# Patient Record
Sex: Female | Born: 1965 | Race: Black or African American | Hispanic: No | Marital: Single | Smoking: Never smoker
Health system: Southern US, Community
[De-identification: ages and names within clinical notes are randomized; demographics above are authoritative.]

## PROBLEM LIST (undated history)

## (undated) DIAGNOSIS — I10 Essential (primary) hypertension: Secondary | ICD-10-CM

---

## 1998-10-25 ENCOUNTER — Encounter: Admission: RE | Admit: 1998-10-25 | Discharge: 1998-10-25 | Payer: Self-pay | Admitting: *Deleted

## 1999-11-26 ENCOUNTER — Emergency Department (HOSPITAL_COMMUNITY): Admission: EM | Admit: 1999-11-26 | Discharge: 1999-11-26 | Payer: Self-pay | Admitting: Emergency Medicine

## 1999-11-26 ENCOUNTER — Encounter: Payer: Self-pay | Admitting: Emergency Medicine

## 2000-07-04 ENCOUNTER — Ambulatory Visit (HOSPITAL_COMMUNITY): Admission: RE | Admit: 2000-07-04 | Discharge: 2000-07-04 | Payer: Self-pay | Admitting: Family Medicine

## 2000-07-04 ENCOUNTER — Encounter: Payer: Self-pay | Admitting: Family Medicine

## 2000-11-14 ENCOUNTER — Ambulatory Visit (HOSPITAL_COMMUNITY): Admission: RE | Admit: 2000-11-14 | Discharge: 2000-11-14 | Payer: Self-pay | Admitting: Family Medicine

## 2000-11-14 ENCOUNTER — Encounter: Payer: Self-pay | Admitting: Family Medicine

## 2001-03-07 ENCOUNTER — Ambulatory Visit (HOSPITAL_COMMUNITY): Admission: RE | Admit: 2001-03-07 | Discharge: 2001-03-07 | Payer: Self-pay | Admitting: Family Medicine

## 2001-03-07 ENCOUNTER — Encounter: Payer: Self-pay | Admitting: Family Medicine

## 2012-03-13 ENCOUNTER — Other Ambulatory Visit: Payer: Self-pay | Admitting: Family Medicine

## 2012-03-13 DIAGNOSIS — Z1231 Encounter for screening mammogram for malignant neoplasm of breast: Secondary | ICD-10-CM

## 2012-04-02 ENCOUNTER — Ambulatory Visit: Payer: Self-pay

## 2012-04-08 ENCOUNTER — Ambulatory Visit: Payer: Self-pay

## 2012-05-13 ENCOUNTER — Ambulatory Visit
Admission: RE | Admit: 2012-05-13 | Discharge: 2012-05-13 | Disposition: A | Discharge status: Home / Self Care | Payer: Commercial Indemnity | Source: Ambulatory Visit | Attending: Family Medicine | Admitting: Family Medicine

## 2012-05-13 DIAGNOSIS — Z1231 Encounter for screening mammogram for malignant neoplasm of breast: Secondary | ICD-10-CM

## 2014-02-04 ENCOUNTER — Encounter (HOSPITAL_BASED_OUTPATIENT_CLINIC_OR_DEPARTMENT_OTHER): Payer: Self-pay | Admitting: Emergency Medicine

## 2014-02-04 ENCOUNTER — Emergency Department (HOSPITAL_BASED_OUTPATIENT_CLINIC_OR_DEPARTMENT_OTHER)
Admission: EM | Admit: 2014-02-04 | Discharge: 2014-02-04 | Disposition: A | Discharge status: Home / Self Care | Payer: Commercial Indemnity | Attending: Emergency Medicine | Admitting: Emergency Medicine

## 2014-02-04 DIAGNOSIS — Z880 Allergy status to penicillin: Secondary | ICD-10-CM | POA: Insufficient documentation

## 2014-02-04 DIAGNOSIS — Z79899 Other long term (current) drug therapy: Secondary | ICD-10-CM | POA: Diagnosis not present

## 2014-02-04 DIAGNOSIS — I10 Essential (primary) hypertension: Secondary | ICD-10-CM | POA: Insufficient documentation

## 2014-02-04 DIAGNOSIS — J029 Acute pharyngitis, unspecified: Secondary | ICD-10-CM | POA: Insufficient documentation

## 2014-02-04 DIAGNOSIS — J3489 Other specified disorders of nose and nasal sinuses: Secondary | ICD-10-CM | POA: Diagnosis present

## 2014-02-04 HISTORY — DX: Essential (primary) hypertension: I10

## 2014-02-04 LAB — RAPID STREP SCREEN (MED CTR MEBANE ONLY): Streptococcus, Group A Screen (Direct): NEGATIVE

## 2014-02-04 MED ORDER — DEXAMETHASONE 4 MG PO TABS
10.0000 mg | ORAL_TABLET | Freq: Once | ORAL | Status: AC
  Administered 2014-02-04: 10 mg via ORAL

## 2014-02-04 MED ORDER — DEXAMETHASONE 4 MG PO TABS
ORAL_TABLET | ORAL | Status: AC
  Filled 2014-02-04: qty 3

## 2014-02-04 NOTE — ED Notes (Addendum)
Patient states she has had a sore throat for the last week, which is associated with generalized tiredness, facial swelling, and non productive cough during the night.  During the day time, patient states she feels like her chest is tight.

## 2014-02-04 NOTE — ED Provider Notes (Signed)
Medical screening examination/treatment/procedure(s) were performed by non-physician practitioner and as supervising physician I was immediately available for consultation/collaboration.    Nelia Shi, MD 02/04/14 1255

## 2014-02-04 NOTE — Discharge Instructions (Signed)

## 2014-02-04 NOTE — ED Provider Notes (Signed)
CSN: [id number]     Arrival date & time 02/04/14  1147 History   First MD Initiated Contact with Patient 02/04/14 1207     Chief Complaint  Patient presents with  . URI     (Consider location/radiation/quality/duration/timing/severity/associated sxs/prior Treatment) Patient is a 48 y.o. female presenting with URI. The history is provided by the patient. No language interpreter was used.  URI Presenting symptoms: congestion, cough and sore throat   Presenting symptoms: no fever   Severity:  Moderate Onset quality:  Sudden Duration:  1 week Timing:  Constant Progression:  Unchanged Chronicity:  New Relieved by:  Nothing Worsened by:  Nothing tried Ineffective treatments:  None tried Associated symptoms: no headaches, no myalgias, no neck pain, no sinus pain and no sneezing     Past Medical History  Diagnosis Date  . Hypertension    History reviewed. No pertinent past surgical history. No family history on file. History  Substance Use Topics  . Smoking status: Never Smoker   . Smokeless tobacco: Never Used  . Alcohol Use: Yes     Comment: occassionally   OB History   Grav Para Term Preterm Abortions TAB SAB Ect Mult Living                 Review of Systems  Constitutional: Negative for fever.  HENT: Positive for congestion and sore throat. Negative for sneezing.   Respiratory: Positive for cough.   Cardiovascular: Negative.   Musculoskeletal: Negative for myalgias and neck pain.  Neurological: Negative for headaches.      Allergies  Penicillins  Home Medications   Prior to Admission medications   Medication Sig Start Date End Date Taking? Authorizing Provider  triamterene-hydrochlorothiazide (MAXZIDE) 75-50 MG per tablet Take 1 tablet by mouth daily.   Yes Historical Provider, MD   BP 140/83  Pulse 78  Temp(Src) 98.4 F (36.9 C) (Oral)  Resp 18  Ht  (1.575 m)  Wt 218 lb (98.884 kg)  BMI 39.86 kg/m2  SpO2 97%  LMP 01/22/2014 Physical Exam   Nursing note and vitals reviewed. Constitutional: She is oriented to person, place, and time. She appears well-developed and well-nourished.  HENT:  Right Ear: External ear normal.  Left Ear: External ear normal.  Nose: Rhinorrhea present.  Mouth/Throat: Posterior oropharyngeal erythema present.  Cardiovascular: Normal rate and regular rhythm.   Pulmonary/Chest: Effort normal and breath sounds normal.  Musculoskeletal: Normal range of motion.  Neurological: She is oriented to person, place, and time.  Skin: No rash noted.    ED Course  Procedures (including critical care time) Labs Review Labs Reviewed  RAPID STREP SCREEN  CULTURE, GROUP A STREP    Imaging Review No results found.   EKG Interpretation None      MDM   Final diagnoses:  Pharyngitis    Likely viral in nature. Pt given 10 of decadron po and no im dose available    Teressa Lower, NP 02/04/14 1253

## 2014-02-06 LAB — CULTURE, GROUP A STREP

## 2017-07-06 ENCOUNTER — Emergency Department (HOSPITAL_COMMUNITY): Payer: BLUE CROSS/BLUE SHIELD

## 2017-07-06 ENCOUNTER — Other Ambulatory Visit: Payer: Self-pay

## 2017-07-06 ENCOUNTER — Observation Stay (HOSPITAL_COMMUNITY)
Admission: EM | Admit: 2017-07-06 | Discharge: 2017-07-08 | Disposition: A | Discharge status: Home / Self Care | Payer: BLUE CROSS/BLUE SHIELD | Attending: Family Medicine | Admitting: Family Medicine

## 2017-07-06 ENCOUNTER — Encounter (HOSPITAL_COMMUNITY): Payer: Self-pay

## 2017-07-06 DIAGNOSIS — Z79899 Other long term (current) drug therapy: Secondary | ICD-10-CM | POA: Insufficient documentation

## 2017-07-06 DIAGNOSIS — R519 Headache, unspecified: Secondary | ICD-10-CM

## 2017-07-06 DIAGNOSIS — R51 Headache: Secondary | ICD-10-CM

## 2017-07-06 DIAGNOSIS — R079 Chest pain, unspecified: Secondary | ICD-10-CM | POA: Diagnosis not present

## 2017-07-06 DIAGNOSIS — I169 Hypertensive crisis, unspecified: Secondary | ICD-10-CM | POA: Diagnosis present

## 2017-07-06 DIAGNOSIS — R42 Dizziness and giddiness: Secondary | ICD-10-CM | POA: Diagnosis not present

## 2017-07-06 DIAGNOSIS — I10 Essential (primary) hypertension: Secondary | ICD-10-CM | POA: Diagnosis not present

## 2017-07-06 DIAGNOSIS — I160 Hypertensive urgency: Principal | ICD-10-CM | POA: Insufficient documentation

## 2017-07-06 DIAGNOSIS — T782XXA Anaphylactic shock, unspecified, initial encounter: Secondary | ICD-10-CM | POA: Diagnosis not present

## 2017-07-06 LAB — CBC
HCT: 36.2 % (ref 36.0–46.0)
Hemoglobin: 11.6 g/dL — ABNORMAL LOW (ref 12.0–15.0)
MCH: 24.4 pg — AB (ref 26.0–34.0)
MCHC: 32 g/dL (ref 30.0–36.0)
MCV: 76.2 fL — AB (ref 78.0–100.0)
Platelets: 424 10*3/uL — ABNORMAL HIGH (ref 150–400)
RBC: 4.75 MIL/uL (ref 3.87–5.11)
RDW: 17.8 % — ABNORMAL HIGH (ref 11.5–15.5)
WBC: 10.7 10*3/uL — ABNORMAL HIGH (ref 4.0–10.5)

## 2017-07-06 LAB — BASIC METABOLIC PANEL
ANION GAP: 12 (ref 5–15)
BUN: 10 mg/dL (ref 6–20)
CHLORIDE: 99 mmol/L — AB (ref 101–111)
CO2: 28 mmol/L (ref 22–32)
Calcium: 9.1 mg/dL (ref 8.9–10.3)
Creatinine, Ser: 0.89 mg/dL (ref 0.44–1.00)
GFR calc Af Amer: >60 mL/min (ref >60)
GFR calc non Af Amer: >60 mL/min (ref >60)
Glucose, Bld: 93 mg/dL (ref 65–99)
Potassium: 3.5 mmol/L (ref 3.5–5.1)
Sodium: 139 mmol/L (ref 135–145)

## 2017-07-06 LAB — I-STAT TROPONIN, ED: Troponin i, poc: 0.02 ng/mL (ref 0.00–0.08)

## 2017-07-06 LAB — I-STAT BETA HCG BLOOD, ED (MC, WL, AP ONLY): I-stat hCG, quantitative: <5 m[IU]/mL (ref <5)

## 2017-07-06 MED ORDER — LABETALOL HCL 5 MG/ML IV SOLN
20.0000 mg | Freq: Once | INTRAVENOUS | Status: AC
  Administered 2017-07-06: 20 mg via INTRAVENOUS
  Filled 2017-07-06: qty 4

## 2017-07-06 MED ORDER — GADOBENATE DIMEGLUMINE 529 MG/ML IV SOLN
20.0000 mL | Freq: Once | INTRAVENOUS | Status: AC
  Administered 2017-07-06: 20 mL via INTRAVENOUS

## 2017-07-06 MED ORDER — HYDRALAZINE HCL 20 MG/ML IJ SOLN
10.0000 mg | Freq: Once | INTRAMUSCULAR | Status: AC
Start: 2017-07-07 — End: 2017-07-07
  Administered 2017-07-07: 10 mg via INTRAVENOUS
  Filled 2017-07-06: qty 1

## 2017-07-06 MED ORDER — NITROGLYCERIN 2 % TD OINT
1.0000 [in_us] | TOPICAL_OINTMENT | Freq: Four times a day (QID) | TRANSDERMAL | Status: DC
  Administered 2017-07-06: 1 [in_us] via TOPICAL
  Filled 2017-07-06: qty 1

## 2017-07-06 MED ORDER — LABETALOL HCL 5 MG/ML IV SOLN
20.0000 mg | Freq: Once | INTRAVENOUS | Status: AC
  Administered 2017-07-07: 20 mg via INTRAVENOUS
  Filled 2017-07-06: qty 4

## 2017-07-06 NOTE — ED Provider Notes (Addendum)
Medical screening examination/treatment/procedure(s) were conducted as a shared visit with non-physician practitioner(s) and myself.  I personally evaluated the patient during the encounter.   EKG Interpretation  Date/Time:  Friday July 06 2017 11:23:37 EST Ventricular Rate:  98 PR Interval:  162 QRS Duration: 72 QT Interval:  342 QTC Calculation: 436 R Axis:   72 Text Interpretation:  Normal sinus rhythm Normal ECG agree. no old comparison Confirmed by Arby BarrettePfeiffer, Brinden Kincheloe [phone]([id number]) on 07/06/2017 4:39:53 PM     Patient reports that she has had dizziness for approximately 2 days.  Has had a spinning quality to it association with that she has had posterior headache that wraps around towards the front of her head.  Patient reports this morning while working on her computer at about 1030 she perceived the vision in her right eye to be blurry.  He does not endorse loss of vision.  This blurriness resolved after several hours.  It was unilateral.  Patient is alert and oriented.  No acute distress.  Clinically well in appearance.  Cranial nerves intact.  Pupils symmetric and responsive.  Normal visual field testing.  Funduscopic exam challenging as patient has constricted pupils however, main vessels are visible.  Normal heart and lung exam.  Normal finger-nose exam.  Upper and lower motor strength 5\5.  Plan will be to initiate treatment of hypertension for hypertensive urgency\emergency.  MRI for vertiginous quality dizziness with complaint of focal right eye blurring.  Patient experienced allergic reaction to contrast material in MRI suite.  Dr. Aleene Davidsonardoma was first the patient evaluation in the MRI suite.  Solu-Medrol and Benadryl were being administered.  At that time I assumed care for the patient's allergic reaction.  She was alert and appropriate.  She complained of feeling of tightness in her throat.  Voice was slightly hoarse.  She was not having any active wheezing.  No generalized body rash or  erythema.  She did appear to have slight puffiness about the eyes.  Stayed with the patient as she was returned to her room.  She was placed on the monitor.  Her rate was sinus in the low 100s.  Remained alert and appropriate.  She was not hypotensive.  Her hypertension persisted through this event.  She continued to report tightness in her throat, 0.3 mg epinephrine delivered by EpiPen.  Patient reported this began to improve sensation of throat tightness.  She continued to be observed.  Allergic reaction symptoms had resolved completely.  At that time we resumed management of blood pressure control with labetalol, hydralazine and Nitropaste.  His mental status remained clear throughout with no development of focal neurologic deficits.  Time of admission, patient's blood pressure had improved significantly and mental status remained clear.  CRITICAL CARE Performed by: Cristy FriedlanderMarchy Taym Twist   Total critical care time: 60 minutes  Critical care time was exclusive of separately billable procedures and treating other patients.  Critical care was necessary to treat or prevent imminent or life-threatening deterioration.  Critical care was time spent personally by me on the following activities: development of treatment plan with patient and/or surrogate as well as nursing, discussions with consultants, evaluation of patient's response to treatment, examination of patient, obtaining history from patient or surrogate, ordering and performing treatments and interventions, ordering and review of laboratory studies, ordering and review of radiographic studies, pulse oximetry and re-evaluation of patient's condition.   Arby BarrettePfeiffer, Jaydian Santana, MD 07/07/17 1539    Arby BarrettePfeiffer, Marisol Glazer, MD 07/07/17 [phone]

## 2017-07-06 NOTE — ED Notes (Signed)
Called to MRI re: possible pt allergic reaction to contrast.  Pt appeared to have facial swelling , complaints of tongue swelling, chest tightness, and "throat closing up".  Upon arrival rapid response and AC at bedside.  Accompanied by EDP Cardama and NT Mandi H.  Pt given 50 mg IV benadryl emergently at 2230 followed by 125 Solu Medrol at 2240.  Dr. Donnald GarrePfeiffer arrived at bedside.  Pt returned to department, cardiac monitoring re established and pt given Epi Pen at 2246 by Dr. Donnald GarrePfeiffer in the left thigh.  Pt continues to have swelling around the face.  States "feels better".  No acute distress at this time.  Airway intact.  Will continue to monitor.

## 2017-07-06 NOTE — ED Notes (Signed)
Patient transported to CT 

## 2017-07-06 NOTE — ED Notes (Signed)
Patient's cheeks, eyes, and tongue are edematous; difficulty speaking but alert and oriented x4. Will continue to monitor.

## 2017-07-06 NOTE — Progress Notes (Signed)
Pt given contrast @ 2220, 07/06/17. Pt stated burning feeling to throat and inability to breathe. Tech took pt out of scan room and called rapid response. Rapid response team assessed and took pt back to ED.

## 2017-07-06 NOTE — ED Notes (Signed)
MRI called to state that they will pick up patient in 20-30 minutes.

## 2017-07-06 NOTE — Progress Notes (Signed)
Responded to RR call in MRI.  Pt received IV contrast while in MRI and began c/o throat burning and swelling.  On assessment, pt sitting up in stretcher, alert and oriented. Skin warm and dry.  Pt tongue swollen and pt having difficulty speaking and swallowing.  Lungs CTA.  Pt placed on 2L [place] and on monitor.  HR-80s SR, SpO2-99% on 2L [place], RR-20s, BP-234/106.  EDP and ED RNs to MRI.  50mg  benadryl and 125mg  solumedrol given IV by ED RN.  Once meds given, pt speech better and pt reports her throat feels better.  Pt transported back to ED room and RN updated.

## 2017-07-06 NOTE — ED Notes (Signed)
EDP aware of bp.  

## 2017-07-06 NOTE — ED Provider Notes (Signed)
[name] EMERGENCY DEPARTMENT Provider Note   CSN: [id number] Arrival date & time: 07/06/17  1119     History   Chief Complaint Chief Complaint  Patient presents with  . Headache  . Dizziness  . Chest Pain    HPI  Ellen Fisher is a 52 y.o. Female with a history of hypertension, presents to the ED for evaluation of 2 days of headache and dizziness, with 2 episodes of brief right-sided chest pain today while she was at work.  Patient reports headache started yesterday morning when she woke up, came on gradually and was not maximal intensity at onset.  Reports headache starts at the base of the head, and extends up over the top of the head, is present on both sides.  Patient reports the headache seemed to go away after lunch, but she woke up with it again today and has not gone away.  Patient reports associated dizziness with this headache, reports it feels like the room is spinning, this dizziness sensation is made worse with position changes.  Patient does report today while she was working on her computer work she noticed some blurring of the right eye, no vision changes on the left, this resolved on its own after a few hours no complete loss of vision.  Patient denies any associated neck pain, numbness, weakness or tingling in the extremities, no associated nausea or vomiting.  Patient denies any fevers or chills.  Patient reports today while she was at work she had 2 very brief episodes of right-sided chest pain that seem to shoot across the right side of her chest and into the right arm, and immediately resolved, no associated shortness of breath, diaphoresis, lightheadedness or syncope.  Patient is currently chest pain-free, continues to complain of headache and intermittent dizziness.  Patient denies any abdominal pain.  Noted to be very hypertensive here in the ED with systolic in the 200s, has history of high blood pressure, takes triamterene-HCTZ regularly, has been  on this medication for 4-5 years, reports systolic blood pressures usually in the 150s, no history of hypertensive.      Past Medical History:  Diagnosis Date  . Hypertension     There are no active problems to display for this patient.   History reviewed. No pertinent surgical history.  OB History    No data available       Home Medications    Prior to Admission medications   Medication Sig Start Date End Date Taking? Authorizing Provider  acetaminophen (TYLENOL) 500 MG tablet Take 1,000 mg by mouth every 6 (six) hours as needed for headache (pain).   Yes [provider]  b complex vitamins tablet Take 1 tablet by mouth daily.   Yes [provider]  Cholecalciferol (VITAMIN D3 PO) Take 1 tablet by mouth daily.   Yes [provider]  Cyanocobalamin (VITAMIN B-12 PO) Take 1 tablet by mouth daily.   Yes [provider]  Melatonin 5 MG TABS Take 5 mg by mouth at bedtime as needed.   Yes [provider]  Omega-3 Fatty Acids (FISH OIL PO) Take 1 capsule by mouth daily.   Yes [provider]  triamterene-hydrochlorothiazide (MAXZIDE-25) 37.5-25 MG tablet Take 1 tablet by mouth daily.   Yes [provider]    Family History History reviewed. No pertinent family history.  Social History Social History   Tobacco Use  . Smoking status: Never Smoker  . Smokeless tobacco: Never Used  Substance  Use Topics  . Alcohol use: Yes    Comment: occassionally  . Drug use: No     Allergies   Gadolinium derivatives and Penicillins   Review of Systems Review of Systems  Constitutional: Negative for chills and fever.  HENT: Negative for congestion, rhinorrhea and sore throat.   Eyes: Positive for visual disturbance. Negative for photophobia, pain and redness.  Respiratory: Negative for apnea, cough and shortness of breath.   Cardiovascular: Positive for chest pain. Negative for palpitations and leg swelling.    Gastrointestinal: Negative for abdominal distention, diarrhea, nausea and vomiting.  Genitourinary: Negative for dysuria and frequency.  Musculoskeletal: Negative for back pain and myalgias.  Skin: Negative for color change, pallor and rash.  Neurological: Positive for dizziness and headaches. Negative for syncope, facial asymmetry, speech difficulty, weakness, light-headedness and numbness.     Physical Exam Updated Vital Signs BP (!) 141/78   Pulse 81   Temp 98 F (36.7 C) (Oral)   Resp 13   Ht 5\' 2"  (1.575 m)   Wt 102.1 kg (225 lb)   LMP 07/06/2017 (Exact Date)   SpO2 98%   BMI 41.15 kg/m   Physical Exam  Constitutional: She is oriented to person, place, and time. She appears well-developed and well-nourished. No distress.  HENT:  Head: Normocephalic and atraumatic.  Eyes: EOM are normal. Pupils are equal, round, and reactive to light. Right eye exhibits no discharge. Left eye exhibits no discharge.  No nystagmus  Neck: Normal range of motion. Neck supple.  No nuchal rigidity  Cardiovascular: Normal rate, regular rhythm and normal heart sounds.  Pulses:      Radial pulses are 2+ on the right side, and 2+ on the left side.       Dorsalis pedis pulses are 2+ on the right side, and 2+ on the left side.  Pulmonary/Chest: Effort normal and breath sounds normal. No stridor. No respiratory distress. She has no wheezes. She has no rales. She exhibits no tenderness.  Abdominal: Soft. Bowel sounds are normal. She exhibits no distension and no mass. There is no tenderness. There is no guarding.  Musculoskeletal: She exhibits no edema or deformity.  Neurological: She is alert and oriented to person, place, and time. Coordination normal.  Speech is clear, able to follow commands CN III-XII intact Normal strength in upper and lower extremities bilaterally including dorsiflexion and plantar flexion, strong and equal grip strength Sensation normal to light and sharp touch Moves  extremities without ataxia, coordination intact, steady gait Normal finger to nose and rapid alternating movements No pronator drift  Skin: Skin is warm and dry. She is not diaphoretic.  Psychiatric: She has a normal mood and affect. Her behavior is normal.  Nursing note and vitals reviewed.    ED Treatments / Results  Labs (all labs ordered are listed, but only abnormal results are displayed) Labs Reviewed  BASIC METABOLIC PANEL - Abnormal; Notable for the following components:      Result Value   Chloride 99 (*)    All other components within normal limits  CBC - Abnormal; Notable for the following components:   WBC 10.7 (*)    Hemoglobin 11.6 (*)    MCV 76.2 (*)    MCH 24.4 (*)    RDW 17.8 (*)    Platelets 424 (*)    All other components within normal limits  I-STAT TROPONIN, ED  I-STAT BETA HCG BLOOD, ED ([facility], [facility], [facility] ONLY)    EKG  EKG Interpretation  Date/Time:  Friday July 06 2017 11:23:37 EST Ventricular Rate:  98 PR Interval:  162 QRS Duration: 72 QT Interval:  342 QTC Calculation: 436 R Axis:   72 Text Interpretation:  Normal sinus rhythm Normal ECG agree. no old comparison Confirmed by Arby Barrette [phone]) on 07/06/2017 4:39:53 PM       Radiology Dg Chest 2 View  Result Date: 07/06/2017 CLINICAL DATA:  Headaches, dizziness for 2 days - today began having sharp pains across chest and almost blacked out in shower - hx of htn, nonsmoker, no known heart or lung issues known EXAM: CHEST  2 VIEW COMPARISON:  None. FINDINGS: Cardiac silhouette is mildly enlarged. No mediastinal or hilar masses. No evidence of adenopathy. Clear lungs. No pleural effusion or pneumothorax. Skeletal structures are unremarkable. IMPRESSION: No active cardiopulmonary disease. Electronically Signed   By: Amie Portland M.D.   On: 07/06/2017 12:00   Ct Head Wo Contrast  Result Date: 07/06/2017 CLINICAL DATA:  Headache and dizziness with blurred vision EXAM: CT HEAD WITHOUT CONTRAST  TECHNIQUE: Contiguous axial images were obtained from the base of the skull through the vertex without intravenous contrast. COMPARISON:  None. FINDINGS: Brain: The ventricles are normal in size and configuration. There is no intracranial mass, hemorrhage, extra-axial fluid collection, or midline shift. Gray-white compartments are normal. No evident acute infarct. Vascular: No hyperdense vessel. There is no appreciable vascular calcification. Skull: The bony calvarium appears intact. Sinuses/Orbits: There is mucosal thickening in the posterior right maxillary antrum. There is mucosal thickening in several ethmoid air cells. Visualized paranasal sinuses elsewhere clear. Orbits appear symmetric bilaterally. Other: Mastoid air cells are clear. IMPRESSION: Mild paranasal sinus disease.  Study otherwise unremarkable. Electronically Signed   By: Bretta Bang III M.D.   On: 07/06/2017 16:49   Mr Brain Wo Contrast  Result Date: 07/06/2017 CLINICAL DATA:  52 y/o  F; headache with dizziness for 2 days. EXAM: MRI HEAD WITHOUT CONTRAST TECHNIQUE: Multiplanar, multiecho pulse sequences of the brain and surrounding structures were obtained without intravenous contrast. The patient had a reaction to 20 cc MultiHance administered intravenously and postcontrast imaging was unable to be acquired. COMPARISON:  07/06/2017 CT head FINDINGS: Brain: No acute infarction, hemorrhage, hydrocephalus, extra-axial collection or mass lesion. No significant signal abnormality. Vascular: Normal flow voids. Skull and upper cervical spine: Normal marrow signal. Sinuses/Orbits: Mild right maxillary sinus mucosal thickening. Otherwise no abnormal signal of paranasal sinuses. Orbits are unremarkable. Other: None. IMPRESSION: 1. Normal MRI of the brain without contrast. 2. Mild right maxillary sinus mucosal thickening. Electronically Signed   By: Mitzi Hansen M.D.   On: 07/06/2017 23:05    Procedures Procedures (including  critical care time)  Medications Ordered in ED Medications  nitroGLYCERIN (NITROGLYN) 2 % ointment 1 inch (1 inch Topical Given 07/06/17 2306)  labetalol (NORMODYNE,TRANDATE) injection 20 mg (20 mg Intravenous Given 07/06/17 1802)  labetalol (NORMODYNE,TRANDATE) injection 20 mg (20 mg Intravenous Given 07/06/17 2253)  gadobenate dimeglumine (MULTIHANCE) injection 20 mL (20 mLs Intravenous Contrast Given 07/06/17 2253)  labetalol (NORMODYNE,TRANDATE) injection 20 mg (20 mg Intravenous Given 07/07/17 0032)  hydrALAZINE (APRESOLINE) injection 10 mg (10 mg Intravenous Given 07/07/17 0008)     Initial Impression / Assessment and Plan / ED Course  I have reviewed the triage vital signs and the nursing notes.  Pertinent labs & imaging results that were available during my care of the patient were reviewed by me and considered in my medical decision making (see chart for details).  Patient presents  for evaluation of 2 days of headache and dizziness.  Patient reports a 2 brief episodes of right-sided chest pain today while working.  Patient also reports while working at her computer she noted some blurry vision on the right side, which resolved on its own after several hours.  Patient currently chest pain-free but continues to complain of headache and intermittent dizziness.  Patient is well-appearing, but has been very hypertensive throughout her ED stay, with BPs as high as 252/157. No neurologic deficits found on exam.   Labs overall reassuring, no leukocytosis and hemoglobin is stable, no electrolyte derangements, kidney function is normal with a creatinine of 0.89.  Troponin is normal, no evidence of endorgan damage currently to suggest hypertensive emergency.  Chest x-ray shows no acute cardiopulmonary disease, viewed by myself and in agreement with the radiologist interpretation.  EKG reviewed and interpreted and shows normal sinus rhythm, no previous comparison available.  Given persistent headache, will get  head CT.  Concern for hypertensive urgency, will give 20 mg of labetalol.   Patient discussed with Dr. Clarice PolePfeifer who saw and evaluated the patient as well.  Feel that this is likely hypertensive urgency but given that patient did have some vision changes earlier today and has continued to have vertiginous symptoms, there is some concern for cerebellar stroke, will consult neurology for recommendations on MRI.  6:15 PM discussed case with Dr. Amada JupiterKirkpatrick with neurology, who feels that symptoms could be caused by hypertension alone, but elevated blood pressure could also be reactive in response to a cerebellar stroke, recommends getting MRI.   10:20 PM no reaction to contrast, patient was evaluated by Dr. Donnald GarrePfeiffer and appeared to have facial swelling, chest tightness and sensation of throat closing up, patient given 50 of IV Benadryl, Solu-Medrol and epinephrine.  My reevaluation when patient returned from MRI, patient was feeling better, talking and appeared to be in no acute distress, improvement in facial swelling and patient continues to report she is feeling better.  Patient continues to be hypertensive to the 190s additional dose of labetalol as well as an inch of Nitropaste applied, with minimal improvement in blood pressure.  MRI unremarkable, no evidence of cerebellar stroke.  After dose of hydralazine, patient's blood pressure improved to the 170s, with this response feel the blood pressure will be able to be controlled with as needed medications and patient will not need to be started on a drip at this time, patient will need admission for continued management of hypertensive urgency as this is likely what is causing her headache and dizziness.   Case discussed with Dr. Antionette Charpyd with [facility] hospitalist who will see and admit the patient.  Vitals:   07/06/17 2315 07/06/17 2330 07/07/17 0000 07/07/17 0045  BP: (!) 183/106 (!) 192/115 (!) 177/111 (!) 141/78  Pulse: 86 88 86 81  Resp: 13     Temp:       TempSrc:      SpO2: 100% 98% 98% 98%  Weight:      Height:       Patient discussed with Dr. Donnald GarrePfeiffer, who saw patient as well and agrees with plan.  Final Clinical Impressions(s) / ED Diagnoses   Final diagnoses:  Hypertensive urgency  Dizziness  Bad headache  Chest pain, unspecified type  Anaphylaxis, initial encounter    ED Discharge Orders    None       Dartha LodgeFord, Trilby Way N, [place]PA-C 07/07/17 0102    Arby BarrettePfeiffer, Marcy, MD 07/07/17 1540

## 2017-07-06 NOTE — ED Notes (Addendum)
BP rechecked and is 252/157 with an extra large cuff. Pt denies chest pain at this time but is still dizzy. No neuro deficits.

## 2017-07-06 NOTE — ED Triage Notes (Signed)
Pt endorses headache with dizziness x 2 days and "almost passed out in the shower this morning" Pt also endorses having right sided chest pain this morning with radiation to the right arm. VSS. Axox4.

## 2017-07-07 ENCOUNTER — Other Ambulatory Visit: Payer: Self-pay

## 2017-07-07 ENCOUNTER — Encounter (HOSPITAL_COMMUNITY): Payer: Self-pay | Admitting: Family Medicine

## 2017-07-07 DIAGNOSIS — T782XXA Anaphylactic shock, unspecified, initial encounter: Secondary | ICD-10-CM | POA: Diagnosis not present

## 2017-07-07 DIAGNOSIS — I10 Essential (primary) hypertension: Secondary | ICD-10-CM | POA: Diagnosis not present

## 2017-07-07 DIAGNOSIS — I169 Hypertensive crisis, unspecified: Secondary | ICD-10-CM

## 2017-07-07 LAB — BASIC METABOLIC PANEL
Anion gap: 14 (ref 5–15)
BUN: 10 mg/dL (ref 6–20)
CHLORIDE: 98 mmol/L — AB (ref 101–111)
CO2: 22 mmol/L (ref 22–32)
Calcium: 8.5 mg/dL — ABNORMAL LOW (ref 8.9–10.3)
Creatinine, Ser: 0.9 mg/dL (ref 0.44–1.00)
GFR calc Af Amer: >60 mL/min (ref >60)
GFR calc non Af Amer: >60 mL/min (ref >60)
Glucose, Bld: 160 mg/dL — ABNORMAL HIGH (ref 65–99)
POTASSIUM: 3.3 mmol/L — AB (ref 3.5–5.1)
SODIUM: 134 mmol/L — AB (ref 135–145)

## 2017-07-07 LAB — MRSA PCR SCREENING: MRSA BY PCR: NEGATIVE

## 2017-07-07 LAB — HIV ANTIBODY (ROUTINE TESTING W REFLEX): HIV Screen 4th Generation wRfx: NONREACTIVE

## 2017-07-07 MED ORDER — FAMOTIDINE IN NACL 20-0.9 MG/50ML-% IV SOLN
20.0000 mg | Freq: Two times a day (BID) | INTRAVENOUS | Status: DC
  Administered 2017-07-07 – 2017-07-08 (×4): 20 mg via INTRAVENOUS
  Filled 2017-07-07 (×5): qty 50

## 2017-07-07 MED ORDER — POTASSIUM CHLORIDE 10 MEQ/100ML IV SOLN
10.0000 meq | INTRAVENOUS | Status: AC
  Administered 2017-07-07 (×2): 10 meq via INTRAVENOUS
  Filled 2017-07-07 (×2): qty 100

## 2017-07-07 MED ORDER — SODIUM CHLORIDE 0.9 % IV SOLN: 250.0000 mL | INTRAVENOUS | Status: DC | PRN

## 2017-07-07 MED ORDER — LABETALOL HCL 5 MG/ML IV SOLN: 10.0000 mg | INTRAVENOUS | Status: DC | PRN

## 2017-07-07 MED ORDER — DIPHENHYDRAMINE HCL 50 MG/ML IJ SOLN
25.0000 mg | Freq: Once | INTRAMUSCULAR | Status: AC
  Administered 2017-07-07: 25 mg via INTRAVENOUS
  Filled 2017-07-07: qty 1

## 2017-07-07 MED ORDER — METHYLPREDNISOLONE SODIUM SUCC 125 MG IJ SOLR: 125.0000 mg | Freq: Once | INTRAMUSCULAR | Status: DC

## 2017-07-07 MED ORDER — ACETAMINOPHEN 325 MG PO TABS
650.0000 mg | ORAL_TABLET | Freq: Four times a day (QID) | ORAL | Status: DC | PRN
  Administered 2017-07-07: 650 mg via ORAL
  Filled 2017-07-07: qty 2

## 2017-07-07 MED ORDER — SODIUM CHLORIDE 0.9% FLUSH: 3.0000 mL | INTRAVENOUS | Status: DC | PRN

## 2017-07-07 MED ORDER — SODIUM CHLORIDE 0.9% FLUSH: 3.0000 mL | Freq: Two times a day (BID) | INTRAVENOUS | Status: DC

## 2017-07-07 MED ORDER — DIPHENHYDRAMINE HCL 50 MG/ML IJ SOLN
50.0000 mg | Freq: Once | INTRAMUSCULAR | Status: AC
  Administered 2017-07-06: 50 mg via INTRAVENOUS

## 2017-07-07 MED ORDER — EPINEPHRINE 0.3 MG/0.3ML IJ SOAJ
0.3000 mg | Freq: Once | INTRAMUSCULAR | Status: AC | PRN
  Administered 2017-07-07: 0.3 mg via INTRAMUSCULAR
  Filled 2017-07-07: qty 0.3

## 2017-07-07 MED ORDER — SODIUM CHLORIDE 0.9% FLUSH
3.0000 mL | Freq: Two times a day (BID) | INTRAVENOUS | Status: DC
  Administered 2017-07-07: 3 mL via INTRAVENOUS

## 2017-07-07 MED ORDER — ENOXAPARIN SODIUM 40 MG/0.4ML ~~LOC~~ SOLN
40.0000 mg | SUBCUTANEOUS | Status: DC
  Filled 2017-07-07: qty 0.4

## 2017-07-07 MED ORDER — TRIAMTERENE-HCTZ 37.5-25 MG PO TABS
1.0000 | ORAL_TABLET | Freq: Every day | ORAL | Status: DC
Start: 2017-07-07 — End: 2017-07-08
  Administered 2017-07-07 – 2017-07-08 (×2): 1 via ORAL
  Filled 2017-07-07 (×2): qty 1

## 2017-07-07 MED ORDER — SODIUM CHLORIDE 0.9 % IV SOLN
INTRAVENOUS | Status: DC
  Administered 2017-07-07: 09:00:00 via INTRAVENOUS

## 2017-07-07 MED ORDER — ONDANSETRON HCL 4 MG PO TABS: 4.0000 mg | ORAL_TABLET | Freq: Four times a day (QID) | ORAL | Status: DC | PRN

## 2017-07-07 MED ORDER — EPINEPHRINE 0.3 MG/0.3ML IJ SOAJ
0.3000 mg | Freq: Once | INTRAMUSCULAR | Status: AC
  Administered 2017-07-06: 0.3 mg via INTRAMUSCULAR

## 2017-07-07 MED ORDER — SENNOSIDES-DOCUSATE SODIUM 8.6-50 MG PO TABS: 1.0000 | ORAL_TABLET | Freq: Every evening | ORAL | Status: DC | PRN

## 2017-07-07 MED ORDER — HYDROCODONE-ACETAMINOPHEN 5-325 MG PO TABS: 1.0000 | ORAL_TABLET | ORAL | Status: DC | PRN

## 2017-07-07 MED ORDER — VITAMIN D 1000 UNITS PO TABS
1000.0000 [IU] | ORAL_TABLET | Freq: Every day | ORAL | Status: DC
  Administered 2017-07-07 – 2017-07-08 (×2): 1000 [IU] via ORAL
  Filled 2017-07-07 (×2): qty 1

## 2017-07-07 MED ORDER — OMEGA-3-ACID ETHYL ESTERS 1 G PO CAPS
1.0000 g | ORAL_CAPSULE | Freq: Every day | ORAL | Status: DC
  Administered 2017-07-07 – 2017-07-08 (×2): 1 g via ORAL
  Filled 2017-07-07 (×2): qty 1

## 2017-07-07 MED ORDER — ALBUTEROL SULFATE (2.5 MG/3ML) 0.083% IN NEBU: 2.5000 mg | INHALATION_SOLUTION | RESPIRATORY_TRACT | Status: DC | PRN

## 2017-07-07 MED ORDER — ONDANSETRON HCL 4 MG/2ML IJ SOLN: 4.0000 mg | Freq: Four times a day (QID) | INTRAMUSCULAR | Status: DC | PRN

## 2017-07-07 MED ORDER — EPINEPHRINE 0.3 MG/0.3ML IJ SOAJ
0.3000 mg | Freq: Once | INTRAMUSCULAR | Status: DC | PRN
  Filled 2017-07-07: qty 0.3

## 2017-07-07 MED ORDER — ACETAMINOPHEN 650 MG RE SUPP: 650.0000 mg | Freq: Four times a day (QID) | RECTAL | Status: DC | PRN

## 2017-07-07 MED ORDER — BISACODYL 5 MG PO TBEC: 5.0000 mg | DELAYED_RELEASE_TABLET | Freq: Every day | ORAL | Status: DC | PRN

## 2017-07-07 MED ORDER — METHYLPREDNISOLONE SODIUM SUCC 125 MG IJ SOLR
125.0000 mg | Freq: Once | INTRAMUSCULAR | Status: AC
  Administered 2017-07-06: 125 mg via INTRAVENOUS

## 2017-07-07 MED ORDER — B COMPLEX PO TABS
1.0000 | ORAL_TABLET | Freq: Every day | ORAL | Status: DC
  Filled 2017-07-07 (×2): qty 1

## 2017-07-07 NOTE — H&P (Signed)
History and Physical    Ellen Fisher ZOX:[id number] DOB: 12/01/1965 DOA: 07/06/2017  PCP: Burnis Medin, PA-C   Patient coming from: Home  Chief Complaint: Headache, lightheadedness   HPI: Ellen Fisher is a 52 y.o. female with medical history significant for hypertension and chronic microcytic anemia, now presenting to the emergency department for evaluation of headache and lightheadedness.  Patient reports that she had been in her usual state of health until approximately 2 days ago when she developed a generalized headache and lightheadedness.  Headache has been constant, waxing and waning in severity associated with lightheadedness, but not vertigo, and without change in vision or hearing or focal numbness or weakness.  She reports some transient right-sided chest pain that has resolved.  Denies shortness of breath or cough.  Denies any recent fall or trauma.  She has never experienced similar symptoms previously.  ED Course: Upon arrival to the ED, patient is found to be afebrile, saturating well on room air, slightly tachycardic, and hypertensive to 248/128.  EKG features a normal sinus rhythm, chest x-ray is negative for acute cardiopulmonary disease, and MRI brain is a normal study.  Chemistry panel is unremarkable and CBC is notable for mild leukocytosis and stable chronic microcytic anemia with hemoglobin of 11.6.  Troponin is within normal limits.  ED physician discussed the case with neurology.  Patient was treated with labetalol IV, IV hydralazine, and one inch of nitroglycerin ointment.  Patient was given contrast agent for MRI and soon developed respiratory distress and swelling of the tongue and lips.  She was treated with IM epinephrine, Benadryl, and Solu-Medrol.  Respiratory distress resolved and the swelling continues to improve.  Blood pressure normalized.  Patient remains hemodynamically stable, in no apparent respiratory distress, and will be observed on the stepdown  unit for ongoing evaluation and management of hypertensive crisis and anaphylaxis.  Review of Systems:  All other systems reviewed and apart from HPI, are negative.  Past Medical History:  Diagnosis Date  . Hypertension     History reviewed. No pertinent surgical history.   reports that  has never smoked. she has never used smokeless tobacco. She reports that she drinks alcohol. She reports that she does not use drugs.  Allergies  Allergen Reactions  . Gadolinium Derivatives Anaphylaxis  . Penicillins Hives and Itching    Has patient had a PCN reaction causing immediate rash, facial/tongue/throat swelling, SOB or lightheadedness with hypotension: Yes Has patient had a PCN reaction causing severe rash involving mucus membranes or skin necrosis: No Has patient had a PCN reaction that required hospitalization: No Has patient had a PCN reaction occurring within the last 10 years: Yes If all of the above answers are "NO", then may proceed with Cephalosporin use.    History reviewed. No pertinent family history.   Prior to Admission medications   Medication Sig Start Date End Date Taking? Authorizing Provider  acetaminophen (TYLENOL) 500 MG tablet Take 1,000 mg by mouth every 6 (six) hours as needed for headache (pain).   Yes [provider]  b complex vitamins tablet Take 1 tablet by mouth daily.   Yes [provider]  Cholecalciferol (VITAMIN D3 PO) Take 1 tablet by mouth daily.   Yes [provider]  Cyanocobalamin (VITAMIN B-12 PO) Take 1 tablet by mouth daily.   Yes [provider]  Melatonin 5 MG TABS Take 5 mg by mouth at bedtime as needed.   Yes [provider]  Omega-3 Fatty Acids (FISH OIL  PO) Take 1 capsule by mouth daily.   Yes [provider]  triamterene-hydrochlorothiazide (MAXZIDE-25) 37.5-25 MG tablet Take 1 tablet by mouth daily.   Yes [provider]    Physical Exam: Vitals:   07/07/17 0000 07/07/17  0045 07/07/17 0130 07/07/17 0131  BP: (!) 177/111 (!) 141/78  (!) 145/88  Pulse: 86 81 93 87  Resp:      Temp:      TempSrc:      SpO2: 98% 98% 99% 98%  Weight:      Height:          Constitutional: NAD, calm  Eyes: PERTLA, lids and conjunctivae normal ENMT: Mucous membranes are moist. Posterior pharynx clear of any exudate or lesions.   Neck: normal, supple, no masses, no thyromegaly Respiratory: clear to auscultation bilaterally, no wheezing, no crackles. Normal respiratory effort.    Cardiovascular: S1 & S2 heard, regular rate and rhythm. No significant JVD. Abdomen: No distension, no tenderness, no masses palpated. Bowel sounds normal.  Musculoskeletal: no clubbing / cyanosis. No joint deformity upper and lower extremities.    Skin: no significant rashes, lesions, ulcers. Warm, dry, well-perfused. Neurologic: CN 2-12 grossly intact. Sensation intact. Strength 5/5 in all 4 limbs.  Psychiatric: Alert and oriented x 3. Calm, cooperative.     Labs on Admission: I have personally reviewed following labs and imaging studies  CBC: Recent Labs  Lab 07/06/17 1129  WBC 10.7*  HGB 11.6*  HCT 36.2  MCV 76.2*  PLT 424*   Basic Metabolic Panel: Recent Labs  Lab 07/06/17 1129  NA 139  K 3.5  CL 99*  CO2 28  GLUCOSE 93  BUN 10  CREATININE 0.89  CALCIUM 9.1   GFR: Estimated Creatinine Clearance: 82.8 mL/min (by C-G formula based on SCr of 0.89 mg/dL). Liver Function Tests: No results for input(s): AST, ALT, ALKPHOS, BILITOT, PROT, ALBUMIN in the last 168 hours. No results for input(s): LIPASE, AMYLASE in the last 168 hours. No results for input(s): AMMONIA in the last 168 hours. Coagulation Profile: No results for input(s): INR, PROTIME in the last 168 hours. Cardiac Enzymes: No results for input(s): CKTOTAL, CKMB, CKMBINDEX, TROPONINI in the last 168 hours. BNP (last 3 results) No results for input(s): PROBNP in the last 8760 hours. HbA1C: No results for input(s):  HGBA1C in the last 72 hours. CBG: No results for input(s): GLUCAP in the last 168 hours. Lipid Profile: No results for input(s): CHOL, HDL, LDLCALC, TRIG, CHOLHDL, LDLDIRECT in the last 72 hours. Thyroid Function Tests: No results for input(s): TSH, T4TOTAL, FREET4, T3FREE, THYROIDAB in the last 72 hours. Anemia Panel: No results for input(s): VITAMINB12, FOLATE, FERRITIN, TIBC, IRON, RETICCTPCT in the last 72 hours. Urine analysis: No results found for: COLORURINE, APPEARANCEUR, LABSPEC, PHURINE, GLUCOSEU, HGBUR, BILIRUBINUR, KETONESUR, PROTEINUR, UROBILINOGEN, NITRITE, LEUKOCYTESUR Sepsis Labs: @LABRCNTIP (procalcitonin:4,lacticidven:4) )No results found for this or any previous visit (from the past 240 hour(s)).   Radiological Exams on Admission: Dg Chest 2 View  Result Date: 07/06/2017 CLINICAL DATA:  Headaches, dizziness for 2 days - today began having sharp pains across chest and almost blacked out in shower - hx of htn, nonsmoker, no known heart or lung issues known EXAM: CHEST  2 VIEW COMPARISON:  None. FINDINGS: Cardiac silhouette is mildly enlarged. No mediastinal or hilar masses. No evidence of adenopathy. Clear lungs. No pleural effusion or pneumothorax. Skeletal structures are unremarkable. IMPRESSION: No active cardiopulmonary disease. Electronically Signed   By: Renard Hamperavid  Ormond M.D.  On: 07/06/2017 12:00   Ct Head Wo Contrast  Result Date: 07/06/2017 CLINICAL DATA:  Headache and dizziness with blurred vision EXAM: CT HEAD WITHOUT CONTRAST TECHNIQUE: Contiguous axial images were obtained from the base of the skull through the vertex without intravenous contrast. COMPARISON:  None. FINDINGS: Brain: The ventricles are normal in size and configuration. There is no intracranial mass, hemorrhage, extra-axial fluid collection, or midline shift. Gray-white compartments are normal. No evident acute infarct. Vascular: No hyperdense vessel. There is no appreciable vascular calcification. Skull:  The bony calvarium appears intact. Sinuses/Orbits: There is mucosal thickening in the posterior right maxillary antrum. There is mucosal thickening in several ethmoid air cells. Visualized paranasal sinuses elsewhere clear. Orbits appear symmetric bilaterally. Other: Mastoid air cells are clear. IMPRESSION: Mild paranasal sinus disease.  Study otherwise unremarkable. Electronically Signed   By: Bretta Bang III M.D.   On: 07/06/2017 16:49   Mr Brain Wo Contrast  Result Date: 07/06/2017 CLINICAL DATA:  52 y/o  F; headache with dizziness for 2 days. EXAM: MRI HEAD WITHOUT CONTRAST TECHNIQUE: Multiplanar, multiecho pulse sequences of the brain and surrounding structures were obtained without intravenous contrast. The patient had a reaction to 20 cc MultiHance administered intravenously and postcontrast imaging was unable to be acquired. COMPARISON:  07/06/2017 CT head FINDINGS: Brain: No acute infarction, hemorrhage, hydrocephalus, extra-axial collection or mass lesion. No significant signal abnormality. Vascular: Normal flow voids. Skull and upper cervical spine: Normal marrow signal. Sinuses/Orbits: Mild right maxillary sinus mucosal thickening. Otherwise no abnormal signal of paranasal sinuses. Orbits are unremarkable. Other: None. IMPRESSION: 1. Normal MRI of the brain without contrast. 2. Mild right maxillary sinus mucosal thickening. Electronically Signed   By: Mitzi Hansen M.D.   On: 07/06/2017 23:05    EKG: Independently reviewed. Normal sinus rhythm.   Assessment/Plan  1. Headache, lightheadedness  - Presents with headache and lightheadedness, found to have BP 248/128  - MRI brain is a normal study  - Possibly secondary to marked HTN as she is improving with antihypertensives  - Continue BP control as below, continue neuro checks    2. Hypertension with hypertensive crisis  - BP 248/128 on presentation, possibly responsible for #1  - BP normalized after NTG ointment, multiple  doses IV labetalol, and IV hydralazine  - MRI brain normal; no chest pain or pulm edema; renal fxn normal, check UA - Goal reduction of 25% overnight  - Continue triamterene-HCTZ and use IV labetalol prn SBP >180 or DBP >100    3. Anaphylaxis  - Developed acute respiratory distress with tongue and lip swelling after administration of Multihance MRI contrast  - She was treated with Epi Pen, Benadryl 50 mg IV, and Solu-Medrol 125 mg IV  - Respiratory distress resolved and swelling continues to improve  - Monitor with repeat IM epinephrine for recurrent respiratory distress    4. Microcytic anemia  - Anemia is mild, she denies melena or hematochezia, and both Hgb and MCV appear stable from most recent labs in 2015     DVT prophylaxis: Lovenox Code Status: Full  Family Communication: Mother and father updated at bedside at patient's request Disposition Plan: Observe in SDU Consults called: EDP discussed case with neurologist  Admission status: Observation    Briscoe Deutscher, MD [facility] Pager [phone]  If 7PM-7AM, please contact night-coverage [email or web address] Password [email or web address]  07/07/2017, 1:37 AM

## 2017-07-07 NOTE — ED Notes (Signed)
Patient swelling/ redness has reduced tremendously. Patient is now able to speak in full sentences and speech is much more clear.

## 2017-07-07 NOTE — ED Notes (Signed)
Meal tray ordered 

## 2017-07-07 NOTE — ED Notes (Signed)
Patient called out and stated that she felt like her "throat was closing up" again. Patient is having more difficulty speaking than before.

## 2017-07-07 NOTE — ED Notes (Signed)
Pt ambulated to restroom without difficulty

## 2017-07-07 NOTE — Progress Notes (Signed)
[facility] hospital[facility] update note  Reviewed the H&P written by Dr. Antionette Charpyd , with current plan of care. BP at goal at bedside. No signs of resp distress. Pt drinking liquids. C/o thorat discomfort.  Haydee SalterPhillip M Alandria Butkiewicz MD

## 2017-07-07 NOTE — ED Notes (Signed)
Ordered Breakfast Tray  

## 2017-07-07 NOTE — ED Notes (Signed)
Pt ambulated to restroom with steady gait.

## 2017-07-07 NOTE — ED Provider Notes (Signed)
5:45 AM  Pt admitted to hospitalist service.  Had an anaphylactic reaction after gadolinium from MRI.  Received 50 mg of IV Benadryl, 125 mg of IV Solu-Medrol and 0.3 mg of IM epinephrine after MRI.  Now stating that she feels like her throat is swelling and tight and she is having a hard time breathing.  Phonation does sound slightly off but no hoarse voice or stridor or drooling.  No muffled voice.  No hypoxia.  No hives or hypotension.  Will give another 50 mg of IV Benadryl, IV 20 mg Pepcid and another 0.3 mg of IM epinephrine.  Dr. opened with hospitalist service also at bedside.  She will be monitored closely.  Awaiting admission bed.   Ward, Ellen MawKristen N, DO 07/07/17 [id number]

## 2017-07-07 NOTE — ED Notes (Signed)
Pt given urine specimen collection cup, to collect urine. Educated pt on clean catch, pt acknowledge. When pt finished, pt stated forgot to collect urine sample. Notified Courtney(RN)

## 2017-07-08 DIAGNOSIS — I10 Essential (primary) hypertension: Secondary | ICD-10-CM | POA: Diagnosis not present

## 2017-07-08 DIAGNOSIS — T782XXA Anaphylactic shock, unspecified, initial encounter: Secondary | ICD-10-CM | POA: Diagnosis not present

## 2017-07-08 LAB — CBC WITH DIFFERENTIAL/PLATELET
Basophils Absolute: 0 10*3/uL (ref 0.0–0.1)
Basophils Relative: 0 %
EOS ABS: 0 10*3/uL (ref 0.0–0.7)
EOS PCT: 0 %
HCT: 32.2 % — ABNORMAL LOW (ref 36.0–46.0)
Hemoglobin: 10.2 g/dL — ABNORMAL LOW (ref 12.0–15.0)
LYMPHS ABS: 2.1 10*3/uL (ref 0.7–4.0)
Lymphocytes Relative: 14 %
MCH: 24.3 pg — AB (ref 26.0–34.0)
MCHC: 31.7 g/dL (ref 30.0–36.0)
MCV: 76.7 fL — ABNORMAL LOW (ref 78.0–100.0)
MONO ABS: 0.6 10*3/uL (ref 0.1–1.0)
Monocytes Relative: 4 %
Neutro Abs: 12 10*3/uL — ABNORMAL HIGH (ref 1.7–7.7)
Neutrophils Relative %: 82 %
PLATELETS: 358 10*3/uL (ref 150–400)
RBC: 4.2 MIL/uL (ref 3.87–5.11)
RDW: 17.9 % — ABNORMAL HIGH (ref 11.5–15.5)
WBC: 14.8 10*3/uL — AB (ref 4.0–10.5)

## 2017-07-08 LAB — BASIC METABOLIC PANEL
Anion gap: 11 (ref 5–15)
BUN: 12 mg/dL (ref 6–20)
CHLORIDE: 105 mmol/L (ref 101–111)
CO2: 21 mmol/L — ABNORMAL LOW (ref 22–32)
CREATININE: 0.76 mg/dL (ref 0.44–1.00)
Calcium: 8 mg/dL — ABNORMAL LOW (ref 8.9–10.3)
GFR calc Af Amer: >60 mL/min (ref >60)
GLUCOSE: 95 mg/dL (ref 65–99)
Potassium: 3.8 mmol/L (ref 3.5–5.1)
SODIUM: 137 mmol/L (ref 135–145)

## 2017-07-08 MED ORDER — AMLODIPINE BESYLATE 10 MG PO TABS: 10.0000 mg | ORAL_TABLET | Freq: Every day | ORAL | 0 refills | Status: AC

## 2017-07-08 MED ORDER — AMLODIPINE BESYLATE 10 MG PO TABS
10.0000 mg | ORAL_TABLET | Freq: Every day | ORAL | Status: DC
  Administered 2017-07-08: 10 mg via ORAL
  Filled 2017-07-08: qty 1

## 2017-07-08 MED ORDER — EPINEPHRINE 0.3 MG/0.3ML IJ SOAJ: 0.3000 mg | Freq: Once | INTRAMUSCULAR | 1 refills | Status: AC

## 2017-07-08 MED ORDER — B COMPLEX-C PO TABS
1.0000 | ORAL_TABLET | Freq: Every day | ORAL | Status: DC
  Administered 2017-07-08: 1 via ORAL
  Filled 2017-07-08: qty 1

## 2017-07-08 NOTE — Discharge Summary (Addendum)
Physician Discharge Summary  Ellen BabeRoberta Goodwine JXB:[id number] DOB: [birth year] DOA: 07/06/2017  PCP: Burnis MedinFulbright, Virginia E, PA-C  Admit date: 07/06/2017 Discharge date: 07/08/2017  Time spent: 35 minutes  Recommendations for Outpatient Follow-up:  1. F/u with PCP in 7-10 days    Discharge Diagnoses:  Principal Problem:   Hypertensive crisis Active Problems:   Hypertension   Anaphylactic reaction   Discharge Condition: good  Diet recommendation: heart healthy  Filed Weights   07/06/17 1127 07/07/17 1521 07/08/17 0410  Weight: 102.1 kg (225 lb) 100.4 kg (221 lb 6.4 oz) 100.6 kg (221 lb 11.2 oz)    History of present illness:  Ellen Fisher is a 52 y.o. female with medical history significant for hypertension and chronic microcytic anemia, now presenting to the emergency department for evaluation of headache and lightheadedness.  Patient reports that she had been in her usual state of health until approximately 2 days ago when she developed a generalized headache and lightheadedness.  Headache has been constant, waxing and waning in severity associated with lightheadedness, but not vertigo, and without change in vision or hearing or focal numbness or weakness.  She reports some transient right-sided chest pain that has resolved.  Denies shortness of breath or cough.  Denies any recent fall or trauma.  She has never experienced similar symptoms previously.    Hospital Course:  Patient admitted with hypertensive urgency.  MRI of the brain was normal.  Patient responded to multiple doses of IV hydralazine and labetalol.  Patient was able to reach her blood pressure reduction goal.  Home blood pressure medications were resumed. Trop neg and no CP. EKG with no significant ST changes. Note patient had anaphylactic reaction to MRI contrast media.  Requiring her to get an EpiPen and Benadryl and Solu-Medrol in the emergency room.  She improved over a 24-hour period.  Educated use of an EpiPen  prior to discharge.  Procedures: ---  Consultations:  ---  Discharge Exam: Vitals:   07/08/17 0410 07/08/17 0811  BP: (!) 169/91 (!) 172/89  Pulse: 74 (!) 103  Resp: 17 (!) 21  Temp: 98.6 F (37 C)   SpO2: 97% 97%    General: NCAT Cardiovascular: RRR, No MRG Respiratory: CTAB, nl WOB  Discharge Instructions   Discharge Instructions    Call MD for:  difficulty breathing, headache or visual disturbances   Complete by:  As directed    Call MD for:  extreme fatigue   Complete by:  As directed    Call MD for:  persistant dizziness or light-headedness   Complete by:  As directed    Call MD for:  persistant nausea and vomiting   Complete by:  As directed    Call MD for:  severe uncontrolled pain   Complete by:  As directed    Call MD for:  temperature >100.4   Complete by:  As directed    Diet - low sodium heart healthy   Complete by:  As directed    Increase activity slowly   Complete by:  As directed    Sexual Activity Restrictions   Complete by:  As directed    Till cleared by doctor     Allergies as of 07/08/2017      Reactions   Contrast Media [iodinated Diagnostic Agents] Anaphylaxis   Gadolinium Derivatives Anaphylaxis   Penicillins Hives, Itching   Has patient had a PCN reaction causing immediate rash, facial/tongue/throat swelling, SOB or lightheadedness with hypotension: Yes Has patient had a PCN reaction  causing severe rash involving mucus membranes or skin necrosis: No Has patient had a PCN reaction that required hospitalization: No Has patient had a PCN reaction occurring within the last 10 years: Yes If all of the above answers are "NO", then may proceed with Cephalosporin use.      Medication List    TAKE these medications   acetaminophen 500 MG tablet Commonly known as:  TYLENOL Take 1,000 mg by mouth every 6 (six) hours as needed for headache (pain).   amLODipine 10 MG tablet Commonly known as:  NORVASC Take 1 tablet (10 mg total) by  mouth daily.   b complex vitamins tablet Take 1 tablet by mouth daily.   FISH OIL PO Take 1 capsule by mouth daily.   Melatonin 5 MG Tabs Take 5 mg by mouth at bedtime as needed.   triamterene-hydrochlorothiazide 37.5-25 MG tablet Commonly known as:  MAXZIDE-25 Take 1 tablet by mouth daily.   VITAMIN B-12 PO Take 1 tablet by mouth daily.   VITAMIN D3 PO Take 1 tablet by mouth daily.     ASK your doctor about these medications   EPINEPHrine 0.3 mg/0.3 mL Soaj injection Commonly known as:  EPI-PEN Inject 0.3 mLs (0.3 mg total) into the muscle once for 1 dose. Ask about: Should I take this medication?      Allergies  Allergen Reactions  . Contrast Media [Iodinated Diagnostic Agents] Anaphylaxis  . Gadolinium Derivatives Anaphylaxis  . Penicillins Hives and Itching    Has patient had a PCN reaction causing immediate rash, facial/tongue/throat swelling, SOB or lightheadedness with hypotension: Yes Has patient had a PCN reaction causing severe rash involving mucus membranes or skin necrosis: No Has patient had a PCN reaction that required hospitalization: No Has patient had a PCN reaction occurring within the last 10 years: Yes If all of the above answers are "NO", then may proceed with Cephalosporin use.      The results of significant diagnostics from this hospitalization (including imaging, microbiology, ancillary and laboratory) are listed below for reference.    Significant Diagnostic Studies: Dg Chest 2 View  Result Date: 07/06/2017 CLINICAL DATA:  Headaches, dizziness for 2 days - today began having sharp pains across chest and almost blacked out in shower - hx of htn, nonsmoker, no known heart or lung issues known EXAM: CHEST  2 VIEW COMPARISON:  None. FINDINGS: Cardiac silhouette is mildly enlarged. No mediastinal or hilar masses. No evidence of adenopathy. Clear lungs. No pleural effusion or pneumothorax. Skeletal structures are unremarkable. IMPRESSION: No active  cardiopulmonary disease. Electronically Signed   By: Amie Portland M.D.   On: 07/06/2017 12:00   Ct Head Wo Contrast  Result Date: 07/06/2017 CLINICAL DATA:  Headache and dizziness with blurred vision EXAM: CT HEAD WITHOUT CONTRAST TECHNIQUE: Contiguous axial images were obtained from the base of the skull through the vertex without intravenous contrast. COMPARISON:  None. FINDINGS: Brain: The ventricles are normal in size and configuration. There is no intracranial mass, hemorrhage, extra-axial fluid collection, or midline shift. Gray-white compartments are normal. No evident acute infarct. Vascular: No hyperdense vessel. There is no appreciable vascular calcification. Skull: The bony calvarium appears intact. Sinuses/Orbits: There is mucosal thickening in the posterior right maxillary antrum. There is mucosal thickening in several ethmoid air cells. Visualized paranasal sinuses elsewhere clear. Orbits appear symmetric bilaterally. Other: Mastoid air cells are clear. IMPRESSION: Mild paranasal sinus disease.  Study otherwise unremarkable. Electronically Signed   By: Bretta Bang III M.D.  On: 07/06/2017 16:49   Mr Brain Wo Contrast  Result Date: 07/06/2017 CLINICAL DATA:  52 y/o  F; headache with dizziness for 2 days. EXAM: MRI HEAD WITHOUT CONTRAST TECHNIQUE: Multiplanar, multiecho pulse sequences of the brain and surrounding structures were obtained without intravenous contrast. The patient had a reaction to 20 cc MultiHance administered intravenously and postcontrast imaging was unable to be acquired. COMPARISON:  07/06/2017 CT head FINDINGS: Brain: No acute infarction, hemorrhage, hydrocephalus, extra-axial collection or mass lesion. No significant signal abnormality. Vascular: Normal flow voids. Skull and upper cervical spine: Normal marrow signal. Sinuses/Orbits: Mild right maxillary sinus mucosal thickening. Otherwise no abnormal signal of paranasal sinuses. Orbits are unremarkable. Other: None.  IMPRESSION: 1. Normal MRI of the brain without contrast. 2. Mild right maxillary sinus mucosal thickening. Electronically Signed   By: Mitzi Hansen M.D.   On: 07/06/2017 23:05    Microbiology: Recent Results (from the past 240 hour(s))  MRSA PCR Screening     Status: None   Collection Time: 07/07/17  3:30 PM  Result Value Ref Range Status   MRSA by PCR NEGATIVE NEGATIVE Final    Comment:        The GeneXpert MRSA Assay (FDA approved for NASAL specimens only), is one component of a comprehensive MRSA colonization surveillance program. It is not intended to diagnose MRSA infection nor to guide or monitor treatment for MRSA infections. Performed at [facility], [address] [place], [place] [address]      Labs: Basic Metabolic Panel: Recent Labs  Lab 07/06/17 1129 07/07/17 0305 07/08/17 0539  NA 139 134* 137  K 3.5 3.3* 3.8  CL 99* 98* 105  CO2 28 22 21*  GLUCOSE 93 160* 95  BUN 10 10 12   CREATININE 0.89 0.90 0.76  CALCIUM 9.1 8.5* 8.0*   Liver Function Tests: No results for input(s): AST, ALT, ALKPHOS, BILITOT, PROT, ALBUMIN in the last 168 hours. No results for input(s): LIPASE, AMYLASE in the last 168 hours. No results for input(s): AMMONIA in the last 168 hours. CBC: Recent Labs  Lab 07/06/17 1129 07/08/17 0539  WBC 10.7* 14.8*  NEUTROABS  --  12.0*  HGB 11.6* 10.2*  HCT 36.2 32.2*  MCV 76.2* 76.7*  PLT 424* 358   Cardiac Enzymes: No results for input(s): CKTOTAL, CKMB, CKMBINDEX, TROPONINI in the last 168 hours. BNP: BNP (last 3 results) No results for input(s): BNP in the last 8760 hours.  ProBNP (last 3 results) No results for input(s): PROBNP in the last 8760 hours.  CBG: No results for input(s): GLUCAP in the last 168 hours.     Signed:  Haydee Salter MD  FACP  [facility] 07/08/2017, 1:22 PM

## 2017-07-08 NOTE — Progress Notes (Signed)
Ellen Fisher to be D/C'd home per MD order.  Discussed with the patient and all questions fully answered.  VSS, Skin clean, dry and intact without evidence of skin break down, no evidence of skin tears noted. IV catheter discontinued intact. Site without signs and symptoms of complications. Dressing and pressure applied.  An After Visit Summary was printed and given to the patient. Patient received prescription, Plus letter excusing her from work through Wednesday.  D/c education completed with patient/family including follow up instructions, medication list, d/c activities limitations if indicated, with other d/c instructions as indicated by MD - patient able to verbalize understanding, all questions fully answered.   Patient instructed to return to ED, call 911, or call MD for any changes in condition.   Patient escorted via WC, and D/C home via private auto.  Ellen Fisher 07/08/2017 3:56 PM

## 2017-07-12 ENCOUNTER — Telehealth: Payer: Self-pay

## 2017-07-12 NOTE — Telephone Encounter (Signed)
Am willing to take her on but she needs to be aware it will take awhile to get an appt if she needs one quickly she might need to establish with someone elsse

## 2017-07-12 NOTE — Telephone Encounter (Signed)
Please advise 

## 2017-07-12 NOTE — Telephone Encounter (Signed)
Copied from CRM [id number]. Topic: Appointment Scheduling - Prior Auth Required for Appointment >> Jul 12, 2017 11:21 AM Ellen Fisher, Rosey Batheresa D wrote: No appointment has been scheduled. Patient is requesting NP appointment with Dr. Abner GreenspanBlyth. Per scheduling protocol, this appointment requires a prior authorization prior to scheduling. Please call patient back, thanks.  Route to department's PEC pool.

## 2017-07-13 NOTE — Telephone Encounter (Signed)
Noted  

## 2017-07-13 NOTE — Telephone Encounter (Signed)
Pt wants Abner GreenspanBlyth and willing to wait on appt. I scheduled appt for 10/26/17 and mailed new pt packet.

## 2017-07-13 NOTE — Telephone Encounter (Signed)
Please inform Pt of Dr. Mariel AloeBlyth's decision. If she is unable to wait awhile there are several other providers in the office accepting new patients as well.

## 2017-09-28 ENCOUNTER — Telehealth: Payer: Self-pay | Admitting: Family Medicine

## 2017-09-28 NOTE — Telephone Encounter (Signed)
Copied from CRM #[id number]. Topic: Quick Communication - See Telephone Encounter >> Sep 28, 2017  4:21 PM Ninfa Meeker H wrote: CRM for notification. See Telephone encounter for: 09/28/17.  Left voicemail for pt to call us back and reschedule appt per pcp.

## 2017-09-28 NOTE — Telephone Encounter (Signed)
Copied from CRM #[id number]. Topic: Quick Communication - See Telephone Encounter °>> Sep 28, 2017  4:21 PM Poole, Bridgett H wrote: °CRM for notification. See Telephone encounter for: 09/28/17. ° °Left voicemail for pt to call us back and reschedule appt per pcp. °

## 2017-10-26 ENCOUNTER — Ambulatory Visit: Payer: BLUE CROSS/BLUE SHIELD | Admitting: Family Medicine

## 2017-11-30 ENCOUNTER — Ambulatory Visit: Payer: BLUE CROSS/BLUE SHIELD | Admitting: Family Medicine

## 2018-11-01 ENCOUNTER — Other Ambulatory Visit: Payer: Self-pay

## 2018-11-01 ENCOUNTER — Emergency Department (HOSPITAL_BASED_OUTPATIENT_CLINIC_OR_DEPARTMENT_OTHER)
Admission: EM | Admit: 2018-11-01 | Discharge: 2018-11-01 | Disposition: A | Discharge status: Home / Self Care | Payer: BC Managed Care – PPO | Attending: Emergency Medicine | Admitting: Emergency Medicine

## 2018-11-01 ENCOUNTER — Emergency Department (HOSPITAL_BASED_OUTPATIENT_CLINIC_OR_DEPARTMENT_OTHER): Payer: BC Managed Care – PPO

## 2018-11-01 ENCOUNTER — Encounter (HOSPITAL_BASED_OUTPATIENT_CLINIC_OR_DEPARTMENT_OTHER): Payer: Self-pay | Admitting: *Deleted

## 2018-11-01 DIAGNOSIS — Z79899 Other long term (current) drug therapy: Secondary | ICD-10-CM | POA: Diagnosis not present

## 2018-11-01 DIAGNOSIS — M545 Low back pain: Secondary | ICD-10-CM | POA: Insufficient documentation

## 2018-11-01 DIAGNOSIS — I10 Essential (primary) hypertension: Secondary | ICD-10-CM | POA: Insufficient documentation

## 2018-11-01 DIAGNOSIS — W010XXA Fall on same level from slipping, tripping and stumbling without subsequent striking against object, initial encounter: Secondary | ICD-10-CM | POA: Diagnosis not present

## 2018-11-01 DIAGNOSIS — W19XXXA Unspecified fall, initial encounter: Secondary | ICD-10-CM

## 2018-11-01 LAB — PREGNANCY, URINE: Preg Test, Ur: NEGATIVE

## 2018-11-01 MED ORDER — ACETAMINOPHEN 325 MG PO TABS
650.0000 mg | ORAL_TABLET | Freq: Once | ORAL | Status: AC
  Administered 2018-11-01: 650 mg via ORAL
  Filled 2018-11-01: qty 2

## 2018-11-01 MED ORDER — METHOCARBAMOL 500 MG PO TABS: 500.0000 mg | ORAL_TABLET | Freq: Two times a day (BID) | ORAL | 0 refills | Status: AC

## 2018-11-01 NOTE — ED Notes (Signed)
Patient transported to X-ray 

## 2018-11-01 NOTE — ED Notes (Signed)
ED Provider at bedside. 

## 2018-11-01 NOTE — ED Notes (Signed)
Pt checked out with registration, left before receiving discharge instructions and paperwork.

## 2018-11-01 NOTE — ED Notes (Signed)
Pt denies otc medication pta. 

## 2018-11-01 NOTE — Discharge Instructions (Addendum)
Your back pain should be treated with medicines such as ibuprofen or tylenol and this back pain should get better over the next 2 weeks.   Follow Up: Please follow up with your primary healthcare provider in 1-2 weeks for reassessment. if you do not have a primary care doctor use the resource guide provided to find one.  Low back pain is discomfort in the lower back that may be due to injuries to muscles and ligaments around the spine. Occasionally, it may be caused by a a problem to a part of the spine called a disc. The pain may last several days or a week;  However, most patients get completely well in 4 weeks.   1. Medications: Alternate 600 mg of ibuprofen and [id number] mg of Tylenol every 3 hours as needed for pain. Do not exceed 4000 mg of Tylenol daily.  Take ibuprofen with food to avoid upset stomach issues.   Muscle relaxants:  These medications can help with muscle tightness that is a cause of lower back pain. Most of these medications can cause drowsiness, and it is not safe to drive or use dangerous machinery while taking them.You can take Robaxin as needed for muscle spasm up to twice daily but do not drive, drink alcohol, or operate heavy machinery while taking this medicine because it may make you drowsy.  I typically recommend taking this medicine only at night when you are going to sleep.  You can also cut these tablets in half if they make you feel very drowsy.  2. Treatment: rest, drink plenty of fluids, gentle stretching as discussed (see attached), alternate ice and heat (or stick with whichever feels best) 20 minutes on 20 minutes off. Maintaining your daily activities, including walking, is encourged, as it will help you get better faster than just staying in bed.    Be aware that if you develop new symptoms, such as a fever, leg weakness, difficulty with or loss of control of your urine or bowels, abdominal pain, or more severe pain, you will need to seek medical attention  immediately and  / or return to the Emergency department.

## 2018-11-01 NOTE — ED Triage Notes (Signed)
pt c/o fall on tile floor x 1 day, c/o lower back pain and bil shoulder blade pain

## 2018-11-01 NOTE — ED Provider Notes (Signed)
[facility] EMERGENCY DEPARTMENT Provider Note   CSN: [id number] Arrival date & time: 11/01/18  1539    History   Chief Complaint Chief Complaint  Patient presents with  . Fall    HPI Ellen Fisher is a 53 y.o. female street of hypertension presents to emergency department today with chief complaint of back pain x2 days.  Patient states yesterday while in [employer] she tripped over a pallet causing her to fall backwards onto the floor.  Patient states she landed on her back with her legs and arms up in the ear.  She denies hitting her head.  She did not lose consciousness.  Patient has been ambulatory since the fall.  Today she is describing worsening pain in lumbar spine.  She describes it as a constant dull ache, rating it 9 out of 10 in severity.  She has not taken any medications for back pain prior to arrival because she was worried about things elevating her blood pressure. Denies fevers, weight loss, numbness/weakness of upper and lower extremities, bowel/bladder incontinence, urinary retention, history of cancer, saddle anesthesia, history of back surgery, history of IVDA.     Past Medical History:  Diagnosis Date  . Hypertension     Patient Active Problem List   Diagnosis Date Noted  . Hypertension 07/07/2017  . Hypertensive crisis 07/07/2017  . Anaphylactic reaction 07/07/2017    History reviewed. No pertinent surgical history.   OB History   No obstetric history on file.      Home Medications    Prior to Admission medications   Medication Sig Start Date End Date Taking? Authorizing Provider  acetaminophen (TYLENOL) 500 MG tablet Take 1,000 mg by mouth every 6 (six) hours as needed for headache (pain).    [provider]  amLODipine (NORVASC) 10 MG tablet Take 1 tablet (10 mg total) by mouth daily. 07/08/17   Haydee Salter, MD  b complex vitamins tablet Take 1 tablet by mouth daily.    [provider]  Cholecalciferol (VITAMIN  D3 PO) Take 1 tablet by mouth daily.    [provider]  Cyanocobalamin (VITAMIN B-12 PO) Take 1 tablet by mouth daily.    [provider]  Melatonin 5 MG TABS Take 5 mg by mouth at bedtime as needed.    [provider]  methocarbamol (ROBAXIN) 500 MG tablet Take 1 tablet (500 mg total) by mouth 2 (two) times daily. 11/01/18   Lucienne Sawyers E, PA-C  Omega-3 Fatty Acids (FISH OIL PO) Take 1 capsule by mouth daily.    [provider]  triamterene-hydrochlorothiazide (MAXZIDE-25) 37.5-25 MG tablet Take 1 tablet by mouth daily.    [provider]    Family History History reviewed. No pertinent family history.  Social History Social History   Tobacco Use  . Smoking status: Never Smoker  . Smokeless tobacco: Never Used  Substance Use Topics  . Alcohol use: Yes    Comment: occassionally  . Drug use: No     Allergies   Contrast media [iodinated diagnostic agents]; Gadolinium derivatives; and Penicillins   Review of Systems Review of Systems  Constitutional: Negative for chills and fever.  Genitourinary: Negative for difficulty urinating, dyspareunia and flank pain.  Musculoskeletal: Positive for back pain. Negative for gait problem.  Skin: Negative for wound.  Allergic/Immunologic: Negative for immunocompromised state.  Neurological: Negative for headaches.     Physical Exam Updated Vital Signs BP (!) 154/89 (BP Location: Right Arm)   Pulse Marland Kitchen)  117   Temp 99 F (37.2 C) (Oral)   Resp 14   Ht  (1.575 m)   Wt 98 kg   LMP 10/16/2018   SpO2 99%   BMI 39.51 kg/m   Physical Exam Vitals signs and nursing note reviewed.  Constitutional:      General: She is not in acute distress.    Appearance: She is not ill-appearing.  HENT:     Head: Normocephalic and atraumatic.     Comments: No tenderness to palpation of skull. No deformities or crepitus noted. No open wounds, abrasions or lacerations.    Right Ear: Tympanic  membrane and external ear normal.     Left Ear: Tympanic membrane and external ear normal.     Nose: Nose normal.     Mouth/Throat:     Mouth: Mucous membranes are moist.     Pharynx: Oropharynx is clear.  Eyes:     General: No scleral icterus.       Right eye: No discharge.        Left eye: No discharge.     Extraocular Movements: Extraocular movements intact.     Conjunctiva/sclera: Conjunctivae normal.     Pupils: Pupils are equal, round, and reactive to light.  Neck:     Musculoskeletal: Normal range of motion.     Vascular: No JVD.     Comments: Full ROM intact without spinous process TTP. No bony stepoffs or deformities, no paraspinous muscle TTP or muscle spasms. No rigidity or meningeal signs. No bruising, erythema, or swelling.  Cardiovascular:     Rate and Rhythm: Regular rhythm. Tachycardia present.     Pulses: Normal pulses.          Radial pulses are 2+ on the right side and 2+ on the left side.     Heart sounds: Normal heart sounds.  Pulmonary:     Comments: Lungs clear to auscultation in all fields. Symmetric chest rise. No wheezing, rales, or rhonchi. Abdominal:     Comments: Abdomen is soft, non-distended, and non-tender in all quadrants. No rigidity, no guarding. No peritoneal signs.  Musculoskeletal: Normal range of motion.     Comments: Full range of motion of the thoracic spine and lumbar spine with flexion, hyperextension, and lateral flexion. No midline tenderness or stepoffs. No tenderness to palpation of the spinous processes of the thoracic spine. Tenderness to palpation of spinous processes of lumbar spine. Tenderness to palpation of the paraspinous muscles of right and left lumbar spine. No overlying ecchymosis, erythema or edema.   Skin:    General: Skin is warm and dry.     Capillary Refill: Capillary refill takes less than 2 seconds.     Findings: No bruising, erythema or rash.  Neurological:     Mental Status: She is oriented to person, place, and  time.     GCS: GCS eye subscore is 4. GCS verbal subscore is 5. GCS motor subscore is 6.     Comments: Fluent speech, no facial droop.  Psychiatric:        Behavior: Behavior normal.      ED Treatments / Results  Labs (all labs ordered are listed, but only abnormal results are displayed) Labs Reviewed  PREGNANCY, URINE    EKG None  Radiology Dg Lumbar Spine Complete  Result Date: 11/01/2018 CLINICAL DATA:  Low back pain radiating down the right leg since fall yesterday. EXAM: LUMBAR SPINE - COMPLETE 4+ VIEW COMPARISON:  None. FINDINGS: Five lumbar type  vertebral bodies. No acute fracture or subluxation. Vertebral body heights are preserved. Alignment is normal. Mild disc height loss at L4-L5. Moderate disc height loss at L5-S1. Mild lower lumbar facet arthropathy. The sacroiliac joints are unremarkable. IMPRESSION: 1.  No acute osseous abnormality. 2. Lower lumbar spondylosis, moderate at L5-S1. Electronically Signed   By: Obie DredgeWilliam T Derry M.D.   On: 11/01/2018 18:11    Procedures Procedures (including critical care time)  Medications Ordered in ED Medications  acetaminophen (TYLENOL) tablet 650 mg (650 mg Oral Given 11/01/18 1711)     Initial Impression / Assessment and Plan / ED Course  I have reviewed the triage vital signs and the nursing notes.  Pertinent labs & imaging results that were available during my care of the patient were reviewed by me and considered in my medical decision making (see chart for details).  On arrival patient is nontoxic-appearing.  She is tachycardic to 117, appears slightly anxious and she admits that when wearing a mask she feels like it is hard to breathe.  On exam she has tenderness to lumbar spine and bilateral  paraspinal muscles of lumbar spine.  She has steady gait and walks without difficulty.  Patient given p.o. Tylenol and on reassessment states pain has significantly improved, now rating it 5 out of 10.  Rechecked patient's heart rate  without her mask on and tachycardia improved, ranging 94-99. Xray of lumbar spine viewed by me shows no acute fracture or subluxation.  Discussed symptomatic treatment with patient. Will discharge home with prescription for Robaxin and discussed risks of taking muscle relaxers. Patient is hemodynamically stable, in NAD, and able to ambulate in the ED. Evaluation does not show pathology that would require ongoing emergent intervention or inpatient treatment. I explained the diagnosis to the patient.  Pain has been managed and has no complaints prior to discharge. Patient is comfortable with above plan and is stable for discharge at this time. All questions were answered prior to disposition. Strict return precautions for returning to the ED were discussed. Encouraged follow up with PCP.    Final Clinical Impressions(s) / ED Diagnoses   Final diagnoses:  Fall, initial encounter    ED Discharge Orders         Ordered    methocarbamol (ROBAXIN) 500 MG tablet  2 times daily     11/01/18 1848           Sherene Sireslbrizze, Shahir Karen E, PA-C 11/02/18 1138    Maia PlanLong, Joshua G, MD 11/04/18 [phone]

## 2019-12-17 IMAGING — CR LUMBAR SPINE - COMPLETE 4+ VIEW
5 series · 5 of 5 positions shown · non-contrast
Comparison: None.

CLINICAL DATA: Low back pain radiating down the right leg since
fall yesterday.

EXAM:
LUMBAR SPINE - COMPLETE 4+ VIEW

[t l-spine a.p.]
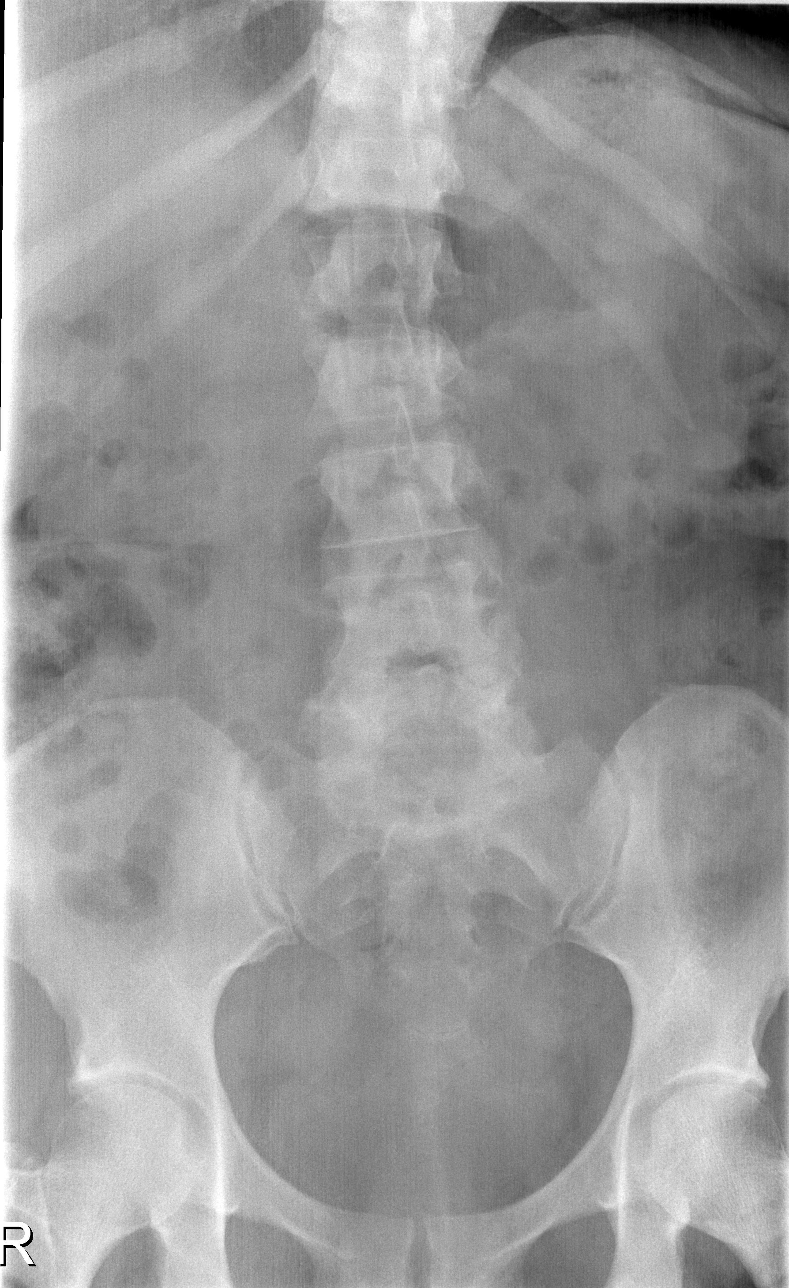

[t l-spine oblique exposure (1 of 2)]
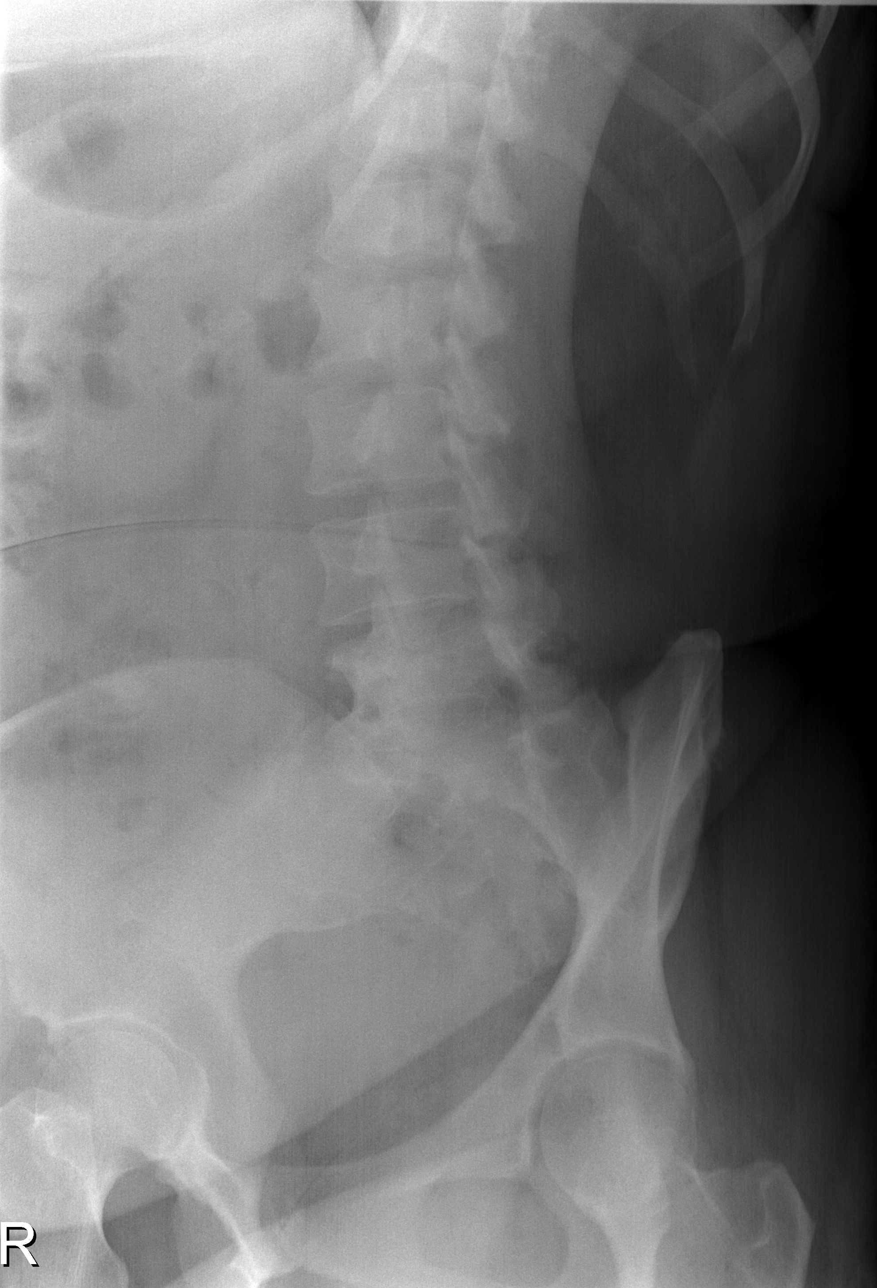

[t l-spine oblique exposure (2 of 2)]
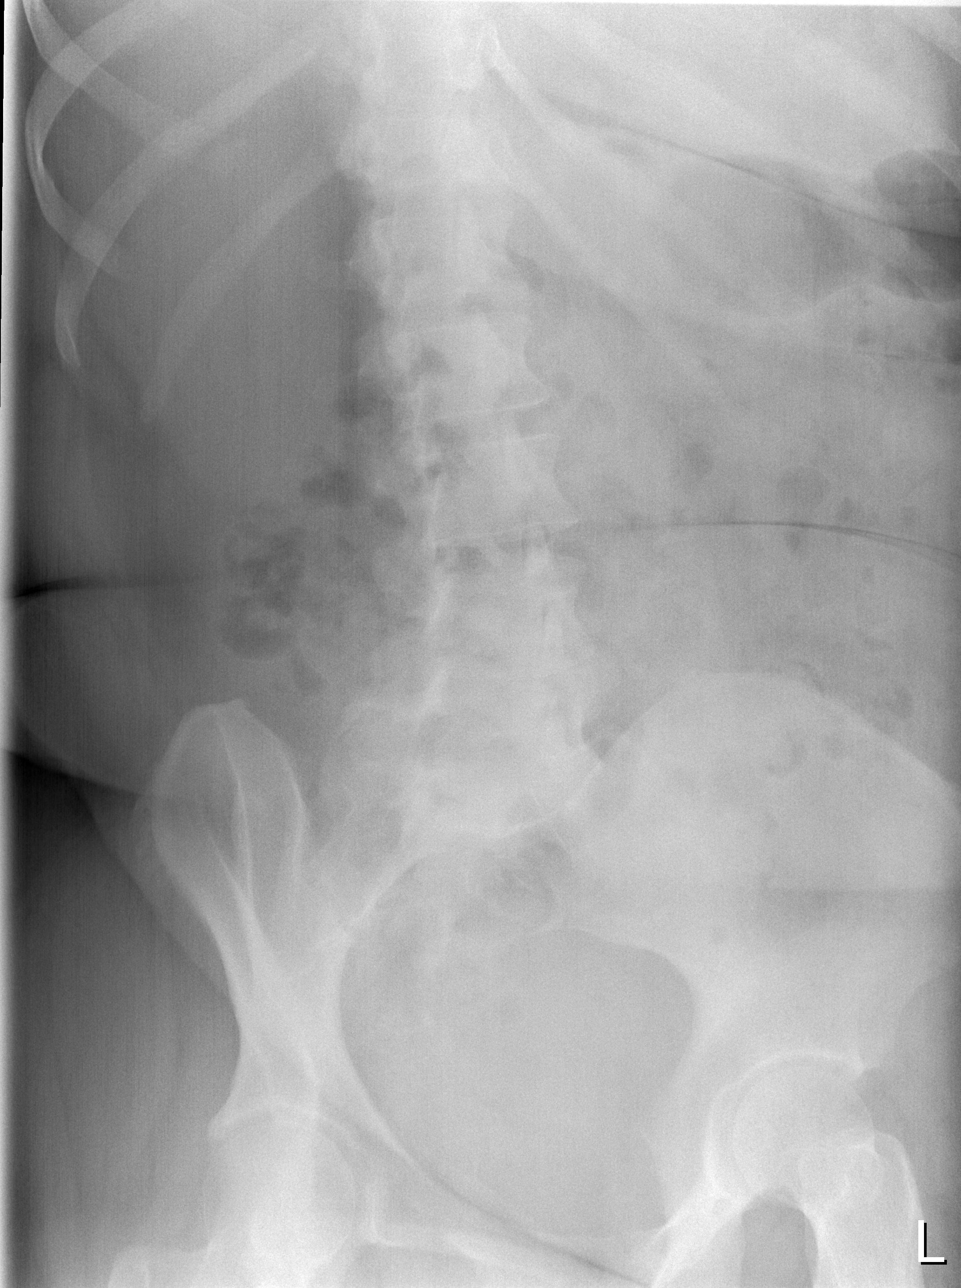

[t l-spine lat *]
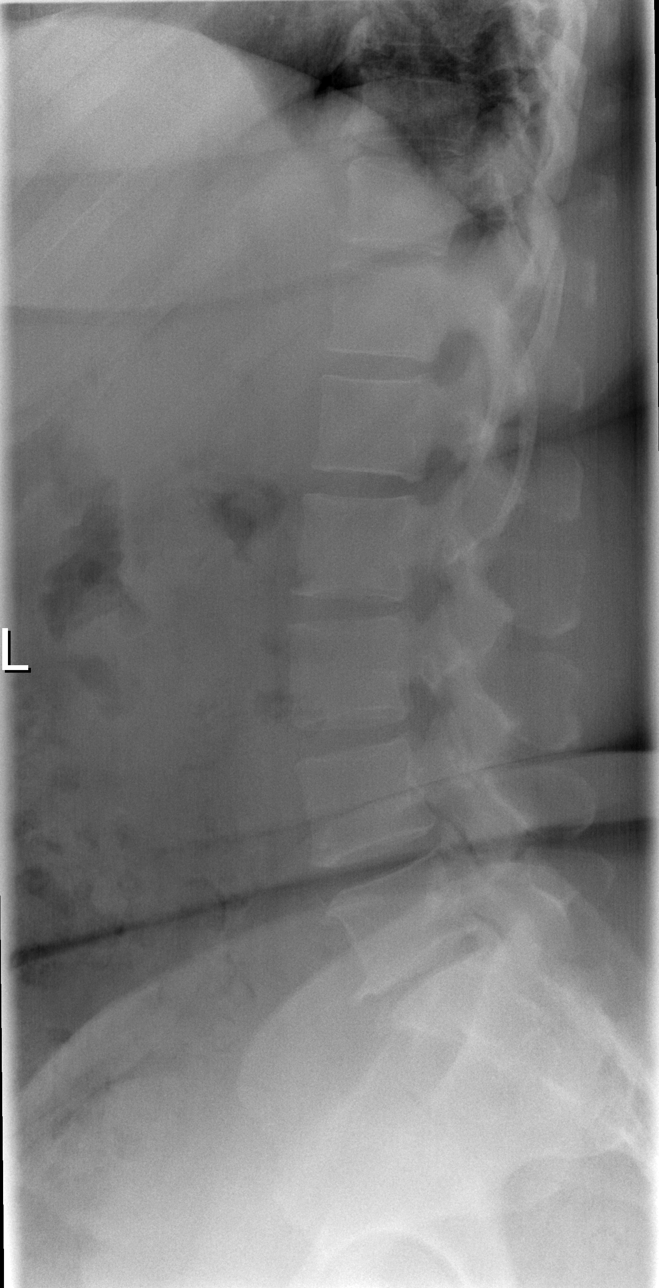

[t l-spine l5-s1 spot *]
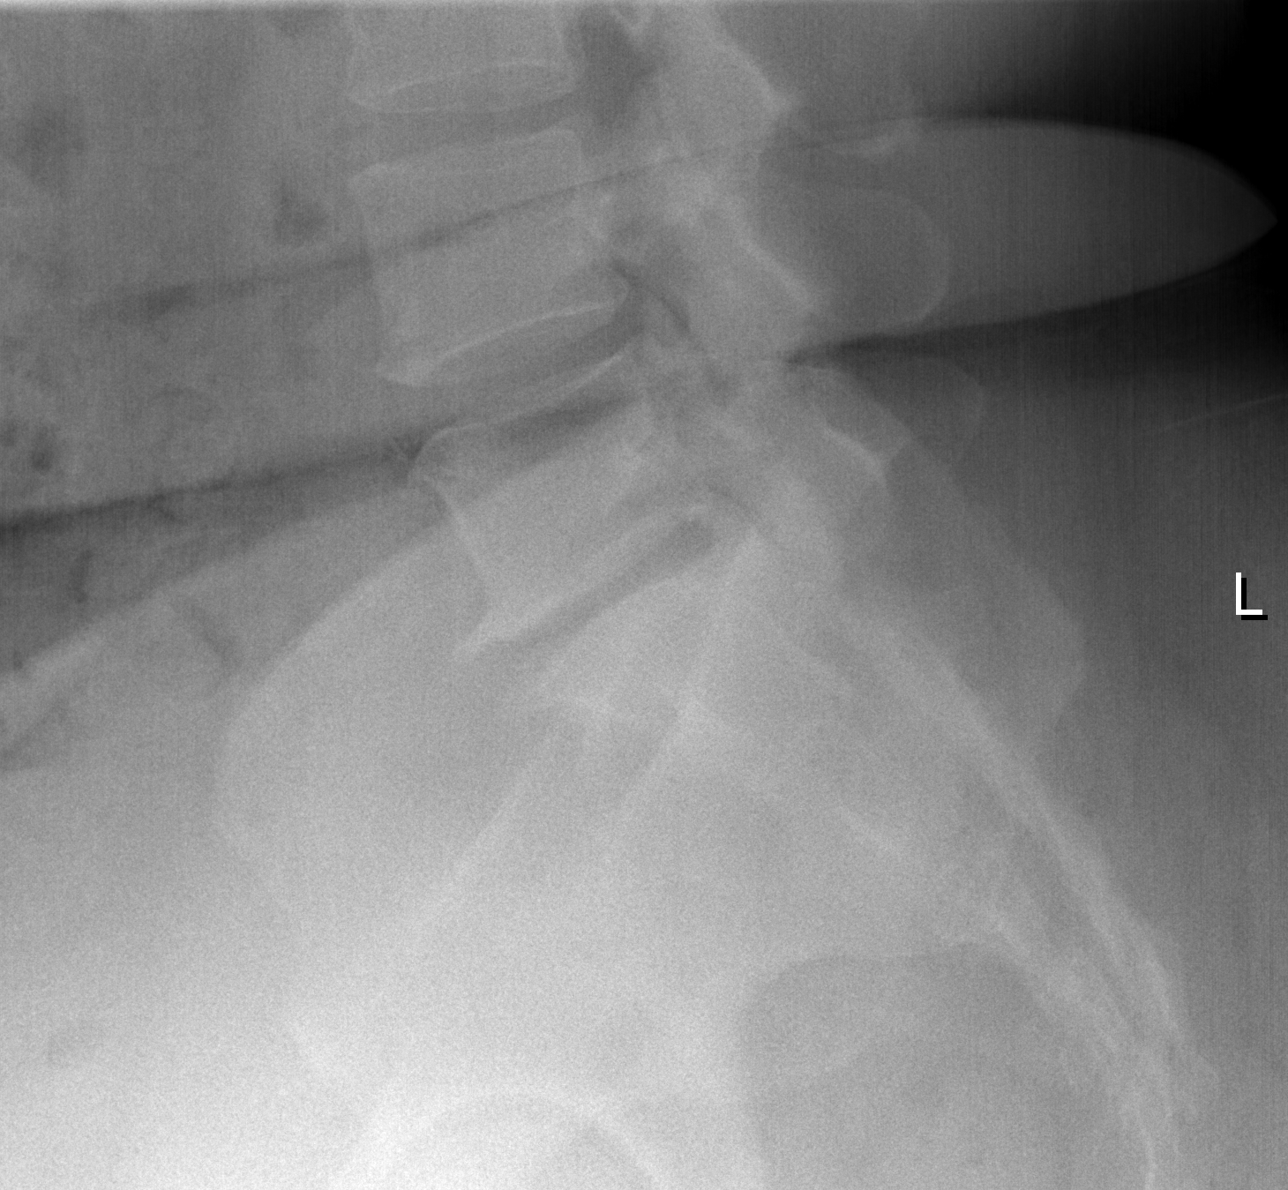

[5 of 5 positions shown; findings below may reference images not displayed]

FINDINGS: Five lumbar type vertebral bodies. No acute fracture or subluxation.
Vertebral body heights are preserved. Alignment is normal. Mild disc
height loss at L4-L5. Moderate disc height loss at L5-S1. Mild lower
lumbar facet arthropathy. The sacroiliac joints are unremarkable.
IMPRESSION: 1.  No acute osseous abnormality.
2. Lower lumbar spondylosis, moderate at L5-S1.

## 2020-08-03 ENCOUNTER — Encounter (HOSPITAL_BASED_OUTPATIENT_CLINIC_OR_DEPARTMENT_OTHER): Payer: Self-pay | Admitting: *Deleted

## 2020-08-03 ENCOUNTER — Other Ambulatory Visit: Payer: Self-pay

## 2020-08-03 ENCOUNTER — Emergency Department (HOSPITAL_BASED_OUTPATIENT_CLINIC_OR_DEPARTMENT_OTHER)
Admission: EM | Admit: 2020-08-03 | Discharge: 2020-08-03 | Disposition: A | Discharge status: Home / Self Care | Payer: No Typology Code available for payment source | Attending: Emergency Medicine | Admitting: Emergency Medicine

## 2020-08-03 DIAGNOSIS — R42 Dizziness and giddiness: Secondary | ICD-10-CM | POA: Diagnosis not present

## 2020-08-03 DIAGNOSIS — Z79899 Other long term (current) drug therapy: Secondary | ICD-10-CM | POA: Insufficient documentation

## 2020-08-03 DIAGNOSIS — I10 Essential (primary) hypertension: Secondary | ICD-10-CM | POA: Insufficient documentation

## 2020-08-03 DIAGNOSIS — R002 Palpitations: Secondary | ICD-10-CM | POA: Insufficient documentation

## 2020-08-03 DIAGNOSIS — R000 Tachycardia, unspecified: Secondary | ICD-10-CM | POA: Diagnosis not present

## 2020-08-03 LAB — CBC WITH DIFFERENTIAL/PLATELET
Abs Immature Granulocytes: 0.04 10*3/uL (ref 0.00–0.07)
Basophils Absolute: 0 10*3/uL (ref 0.0–0.1)
Basophils Relative: 0 %
Eosinophils Absolute: 0 10*3/uL (ref 0.0–0.5)
Eosinophils Relative: 0 %
HCT: 37.9 % (ref 36.0–46.0)
Hemoglobin: 12.5 g/dL (ref 12.0–15.0)
Immature Granulocytes: 0 %
Lymphocytes Relative: 20 %
Lymphs Abs: 2.2 10*3/uL (ref 0.7–4.0)
MCH: 24.2 pg — ABNORMAL LOW (ref 26.0–34.0)
MCHC: 33 g/dL (ref 30.0–36.0)
MCV: 73.4 fL — ABNORMAL LOW (ref 80.0–100.0)
Monocytes Absolute: 0.9 10*3/uL (ref 0.1–1.0)
Monocytes Relative: 8 %
Neutro Abs: 7.7 10*3/uL (ref 1.7–7.7)
Neutrophils Relative %: 72 %
Platelets: 362 10*3/uL (ref 150–400)
RBC: 5.16 MIL/uL — ABNORMAL HIGH (ref 3.87–5.11)
RDW: 17.3 % — ABNORMAL HIGH (ref 11.5–15.5)
WBC: 10.9 10*3/uL — ABNORMAL HIGH (ref 4.0–10.5)
nRBC: 0 % (ref 0.0–0.2)

## 2020-08-03 LAB — BASIC METABOLIC PANEL
Anion gap: 11 (ref 5–15)
BUN: 16 mg/dL (ref 6–20)
CO2: 26 mmol/L (ref 22–32)
Calcium: 9 mg/dL (ref 8.9–10.3)
Chloride: 97 mmol/L — ABNORMAL LOW (ref 98–111)
Creatinine, Ser: 0.5 mg/dL (ref 0.44–1.00)
GFR, Estimated: >60 mL/min (ref >60)
Glucose, Bld: 100 mg/dL — ABNORMAL HIGH (ref 70–99)
Potassium: 3.2 mmol/L — ABNORMAL LOW (ref 3.5–5.1)
Sodium: 134 mmol/L — ABNORMAL LOW (ref 135–145)

## 2020-08-03 MED ORDER — POTASSIUM CHLORIDE CRYS ER 20 MEQ PO TBCR
20.0000 meq | EXTENDED_RELEASE_TABLET | Freq: Once | ORAL | Status: AC
  Administered 2020-08-03: 20 meq via ORAL
  Filled 2020-08-03: qty 1

## 2020-08-03 NOTE — ED Notes (Signed)
Pt informs this RN that she is again having palpitations, PVC's noted on monitor

## 2020-08-03 NOTE — ED Triage Notes (Signed)
Heart palpitations while walking around in a store. She became dizzy and felt like she was going to pass. She drove herself here. She feels fine now.

## 2020-08-03 NOTE — ED Provider Notes (Signed)
[facility] EMERGENCY DEPARTMENT Provider Note  CSN: [id number] Arrival date & time: 08/03/20 1421    History Chief Complaint  Patient presents with  . Palpitations    HPI  Ellen Fisher is a 55 y.o. female with history of HTN on triamterene reports she has had intermittent palpitations the last few weeks, describes it as a rapid pounding heart beat. Today while at the store, she had onset of palpitations and then began feeling lightheaded and dizziness. No CP, SOB or vomiting. She did not lose consciousness. She went out to her car and ran the [facility] and eventually her symptoms subsided. She is currently asymptomatic. She has not seen her doctor about these symptoms yet.    Past Medical History:  Diagnosis Date  . Hypertension     History reviewed. No pertinent surgical history.  No family history on file.  Social History   Tobacco Use  . Smoking status: Never Smoker  . Smokeless tobacco: Never Used  Vaping Use  . Vaping Use: Never used  Substance Use Topics  . Alcohol use: Yes    Comment: occassionally  . Drug use: No     Home Medications Prior to Admission medications   Medication Sig Start Date End Date Taking? Authorizing Provider  b complex vitamins tablet Take 1 tablet by mouth daily.   Yes [provider]  Cholecalciferol (VITAMIN D3 PO) Take 1 tablet by mouth daily.   Yes [provider]  Cyanocobalamin (VITAMIN B-12 PO) Take 1 tablet by mouth daily.   Yes [provider]  triamterene-hydrochlorothiazide (MAXZIDE-25) 37.5-25 MG tablet Take 1 tablet by mouth daily.   Yes [provider]  acetaminophen (TYLENOL) 500 MG tablet Take 1,000 mg by mouth every 6 (six) hours as needed for headache (pain).    [provider]  amLODipine (NORVASC) 10 MG tablet Take 1 tablet (10 mg total) by mouth daily. 07/08/17   Haydee Salter, MD  Melatonin 5 MG TABS Take 5 mg by mouth at bedtime as needed.    [provider]  methocarbamol (ROBAXIN) 500 MG tablet Take 1 tablet (500 mg total) by mouth 2 (two) times daily. 11/01/18   Walisiewicz, Yvonna Alanis E, PA-C  Omega-3 Fatty Acids (FISH OIL PO) Take 1 capsule by mouth daily.    [provider]     Allergies    Contrast media [iodinated diagnostic agents], Gadolinium derivatives, and Penicillins   Review of Systems   Review of Systems A comprehensive review of systems was completed and negative except as noted in HPI.    Physical Exam BP (!) 165/86   Pulse (!) 109   Temp 98.8 F (37.1 C) (Oral)   Resp (!) 22   Ht 5\' 2"  (1.575 m)   Wt 95.3 kg   SpO2 100%   BMI 38.41 kg/m   Physical Exam Vitals and nursing note reviewed.  Constitutional:      Appearance: Normal appearance.  HENT:     Head: Normocephalic and atraumatic.     Nose: Nose normal.     Mouth/Throat:     Mouth: Mucous membranes are moist.  Eyes:     Extraocular Movements: Extraocular movements intact.     Conjunctiva/sclera: Conjunctivae normal.  Cardiovascular:     Rate and Rhythm: Regular rhythm. Tachycardia present.  Pulmonary:     Effort: Pulmonary effort is normal.     Breath sounds: Normal breath sounds.  Abdominal:     General: Abdomen is flat.  Palpations: Abdomen is soft.     Tenderness: There is no abdominal tenderness.  Musculoskeletal:        General: No swelling. Normal range of motion.     Cervical back: Neck supple.  Skin:    General: Skin is warm and dry.  Neurological:     General: No focal deficit present.     Mental Status: She is alert.  Psychiatric:        Mood and Affect: Mood normal.      ED Results / Procedures / Treatments   Labs (all labs ordered are listed, but only abnormal results are displayed) Labs Reviewed  BASIC METABOLIC PANEL - Abnormal; Notable for the following components:      Result Value   Sodium 134 (*)    Potassium 3.2 (*)    Chloride 97 (*)    Glucose, Bld 100 (*)    All other components  within normal limits  CBC WITH DIFFERENTIAL/PLATELET - Abnormal; Notable for the following components:   WBC 10.9 (*)    RBC 5.16 (*)    MCV 73.4 (*)    MCH 24.2 (*)    RDW 17.3 (*)    All other components within normal limits    EKG EKG Interpretation  Date/Time:  Tuesday August 03 2020 14:33:13 EST Ventricular Rate:  110 PR Interval:  132 QRS Duration: 72 QT Interval:  324 QTC Calculation: 438 R Axis:   69 Text Interpretation: Sinus tachycardia Otherwise normal ECG Since last tracing rate faster Confirmed by Linwood Dibbles [phone]) on 08/03/2020 2:42:12 PM    Radiology No results found.  Procedures Procedures  Medications Ordered in the ED Medications  potassium chloride SA (KLOR-CON) CR tablet 20 mEq (20 mEq Oral Given 08/03/20 1553)     MDM Rules/Calculators/A&P MDM Patient with nonspecific palpitations. No convincing symptoms for life-threatening dysrhythmia. She is currently mildly tachycardic but in sinus rhythm. Will check labs, cardiac monitor in the meantime.   ED Course  I have reviewed the triage vital signs and the nursing notes.  Pertinent labs & imaging results that were available during my care of the patient were reviewed by me and considered in my medical decision making (see chart for details).  Clinical Course as of 08/03/20 1634  Tue Aug 03, 2020  1514 Patient reported some palpitations to RN as she was putting in IV and drawing blood. There was a pair of PVCs on the monitor that correlated with her symptoms. Will continue to monitor.  [CS]  1532 CBC unremarkable.  [CS]  1535 BMP with mild hypokalemia, will replete orally. Doubt this is the cause of her palpitations.  [CS]  1630 Patient remains asymptomatic in the ED. She has stayed in sinus rhythm for the most part although mildly tachycardic. She does not complain of any CP or SOB. No leg swelling or hypoxia to suggest PE as the cause of her symptoms.  I reviewed her cardiac monitor tracing and there was  some episodes of PACs in addition to the single episode of PVCs mentioned above. She feels well and would like to go home. She was encouraged to follow up with her doctor in the [place] system to be considered for home heart monitor, such as a ZIO patch. She was advised to call EMS if her symptoms return so that an EKG can be done while symptomatic or to [facility] for any other concerns.  [CS]    Clinical Course User Index [CS] Pollyann Savoy, MD  Final Clinical Impression(s) / ED Diagnoses Final diagnoses:  Palpitations    Rx / DC Orders ED Discharge Orders    None       Pollyann Savoy, MD 08/03/20 [phone]

## 2022-12-14 ENCOUNTER — Ambulatory Visit
Admission: EM | Admit: 2022-12-14 | Discharge: 2022-12-14 | Disposition: A | Discharge status: Home / Self Care | Payer: No Typology Code available for payment source

## 2022-12-14 DIAGNOSIS — R051 Acute cough: Secondary | ICD-10-CM

## 2022-12-14 DIAGNOSIS — J069 Acute upper respiratory infection, unspecified: Secondary | ICD-10-CM | POA: Diagnosis not present

## 2022-12-14 MED ORDER — DOXYCYCLINE HYCLATE 100 MG PO CAPS: 100.0000 mg | ORAL_CAPSULE | Freq: Two times a day (BID) | ORAL | 0 refills | Status: AC

## 2022-12-14 MED ORDER — BENZONATATE 100 MG PO CAPS: 100.0000 mg | ORAL_CAPSULE | Freq: Three times a day (TID) | ORAL | 0 refills | Status: AC | PRN

## 2022-12-14 NOTE — ED Provider Notes (Signed)
[facility]    CSN: [id number] Arrival date & time: 12/14/22  1045      History   Chief Complaint No chief complaint on file.   HPI Ellen Fisher is a 57 y.o. female.   Patient presents with cough and nasal congestion that has been present for 1 week.  Reports that cough is dry.  Denies fever.  Reports that she did have body aches at start of symptoms which is now resolved.  Denies any known sick contacts.  She has not taken any medication for symptoms.  Denies history of asthma or COPD and does not smoke cigarettes.  Patient reports that she has shortness of breath coming from her nasal congestion but denies shortness of breath coming from her chest.  Denies any chest pain.  Patient reports that she does have significant facial pain and pressure.     Past Medical History:  Diagnosis Date   Hypertension     Patient Active Problem List   Diagnosis Date Noted   Hypertension 07/07/2017   Hypertensive crisis 07/07/2017   Anaphylactic reaction 07/07/2017    History reviewed. No pertinent surgical history.  OB History   No obstetric history on file.      Home Medications    Prior to Admission medications   Medication Sig Start Date End Date Taking? Authorizing Provider  atenolol (TENORMIN) 50 MG tablet Take 50 mg by mouth daily. 09/13/22  Yes [provider]  benzonatate (TESSALON) 100 MG capsule Take 1 capsule (100 mg total) by mouth every 8 (eight) hours as needed for cough. 12/14/22  Yes Shephanie Romas, Acie Fredrickson, FNP  doxycycline (VIBRAMYCIN) 100 MG capsule Take 1 capsule (100 mg total) by mouth 2 (two) times daily for 7 days. 12/14/22 12/21/22 Yes [place] Rabenold, Acie Fredrickson, FNP  acetaminophen (TYLENOL) 500 MG tablet Take 1,000 mg by mouth every 6 (six) hours as needed for headache (pain).    [provider]  amLODipine (NORVASC) 10 MG tablet Take 1 tablet (10 mg total) by mouth daily. 07/08/17   Haydee Salter, MD  b complex vitamins tablet Take 1 tablet by  mouth daily.    [provider]  Cholecalciferol (VITAMIN D3 PO) Take 1 tablet by mouth daily.    [provider]  Cyanocobalamin (VITAMIN B-12 PO) Take 1 tablet by mouth daily.    [provider]  Melatonin 5 MG TABS Take 5 mg by mouth at bedtime as needed.    [provider]  methocarbamol (ROBAXIN) 500 MG tablet Take 1 tablet (500 mg total) by mouth 2 (two) times daily. 11/01/18   Walisiewicz, Yvonna Alanis E, PA-C  Omega-3 Fatty Acids (FISH OIL PO) Take 1 capsule by mouth daily.    [provider]  triamterene-hydrochlorothiazide (MAXZIDE-25) 37.5-25 MG tablet Take 1 tablet by mouth daily.    [provider]    Family History History reviewed. No pertinent family history.  Social History Social History   Tobacco Use   Smoking status: Never   Smokeless tobacco: Never  Vaping Use   Vaping status: Never Used  Substance Use Topics   Alcohol use: Yes    Comment: occassionally   Drug use: No     Allergies   Contrast media [iodinated contrast media], Gadolinium derivatives, Penicillins, and Lisinopril   Review of Systems Review of Systems Per HPI  Physical Exam Triage Vital Signs ED Triage Vitals  Encounter Vitals Group     BP 12/14/22 1052 131/71     Systolic  BP Percentile --      Diastolic BP Percentile --      Pulse Rate 12/14/22 1052 83     Resp 12/14/22 1052 13     Temp 12/14/22 1052 98.9 F (37.2 C)     Temp Source 12/14/22 1052 Oral     SpO2 12/14/22 1052 94 %     Weight --      Height --      Head Circumference --      Peak Flow --      Pain Score 12/14/22 1057 7     Pain Loc --      Pain Education --      Exclude from Growth Chart --    No data found.  Updated Vital Signs BP 131/71 (BP Location: Left Arm)   Pulse 83   Temp 98.9 F (37.2 C) (Oral)   Resp 13   LMP 12/11/2022 (Exact Date)   SpO2 94%   Visual Acuity Right Eye Distance:   Left Eye Distance:   Bilateral Distance:    Right Eye Near:    Left Eye Near:    Bilateral Near:     Physical Exam Constitutional:      General: She is not in acute distress.    Appearance: Normal appearance. She is not toxic-appearing or diaphoretic.  HENT:     Head: Normocephalic and atraumatic.     Right Ear: Tympanic membrane and ear canal normal.     Left Ear: Tympanic membrane and ear canal normal.     Nose: Congestion present.     Mouth/Throat:     Mouth: Mucous membranes are moist.     Pharynx: No posterior oropharyngeal erythema.  Eyes:     Extraocular Movements: Extraocular movements intact.     Conjunctiva/sclera: Conjunctivae normal.     Pupils: Pupils are equal, round, and reactive to light.  Cardiovascular:     Rate and Rhythm: Normal rate and regular rhythm.     Pulses: Normal pulses.     Heart sounds: Normal heart sounds.  Pulmonary:     Effort: Pulmonary effort is normal. No respiratory distress.     Breath sounds: Normal breath sounds. No stridor. No wheezing, rhonchi or rales.  Abdominal:     General: Abdomen is flat. Bowel sounds are normal.     Palpations: Abdomen is soft.  Musculoskeletal:        General: Normal range of motion.     Cervical back: Normal range of motion.  Skin:    General: Skin is warm and dry.  Neurological:     General: No focal deficit present.     Mental Status: She is alert and oriented to person, place, and time. Mental status is at baseline.  Psychiatric:        Mood and Affect: Mood normal.        Behavior: Behavior normal.        Thought Content: Thought content normal.        Judgment: Judgment normal.      UC Treatments / Results  Labs (all labs ordered are listed, but only abnormal results are displayed) Labs Reviewed - No data to display  EKG   Radiology No results found.  Procedures Procedures (including critical care time)  Medications Ordered in UC Medications - No data to display  Initial Impression / Assessment and Plan / UC Course  I have reviewed the  triage vital signs and the nursing notes.  Pertinent labs &  imaging results that were available during my care of the patient were reviewed by me and considered in my medical decision making (see chart for details).     Patient's symptoms most likely started off as a viral illness but given duration of symptoms, I am concerned for sinus infection so opted to treat with doxycycline given penicillin allergy.  Benzonatate prescribed to take as needed for cough.  There are no adventitious lung sounds on exam so do not think that chest imaging is necessary.  Advised follow-up if any symptoms persist or worsen.  Patient verbalized understanding and was agreeable with plan. Final Clinical Impressions(s) / UC Diagnoses   Final diagnoses:  Acute upper respiratory infection  Acute cough     Discharge Instructions      I have prescribed you an antibiotic and a cough medication to help alleviate upper respiratory infection.  Follow-up if any symptoms persist or worsen.    ED Prescriptions     Medication Sig Dispense Auth. Provider   doxycycline (VIBRAMYCIN) 100 MG capsule Take 1 capsule (100 mg total) by mouth 2 (two) times daily for 7 days. 14 capsule [place], [place], [place]   benzonatate (TESSALON) 100 MG capsule Take 1 capsule (100 mg total) by mouth every 8 (eight) hours as needed for cough. 21 capsule [place], Acie Fredrickson, [place]      PDMP not reviewed this encounter.   Gustavus Bryant, [place] 12/14/22 1126

## 2022-12-14 NOTE — Discharge Instructions (Signed)
I have prescribed you an antibiotic and a cough medication to help alleviate upper respiratory infection.  Follow-up if any symptoms persist or worsen.

## 2022-12-14 NOTE — ED Triage Notes (Signed)
Pt c/o cough and congestion, onset last Thursday, pt states she has had body aches chills fever. Pt cannot get over cough, feels like she cannot breath.

## 2024-03-06 ENCOUNTER — Ambulatory Visit
Admission: RE | Admit: 2024-03-06 | Discharge: 2024-03-06 | Disposition: A | Discharge status: Home / Self Care | Source: Ambulatory Visit | Attending: Family Medicine | Admitting: Family Medicine

## 2024-03-06 VITALS — BP 129/86 | HR 74 | Temp 98.7°F | Resp 18 | Wt 238.0 lb

## 2024-03-06 DIAGNOSIS — R051 Acute cough: Secondary | ICD-10-CM | POA: Insufficient documentation

## 2024-03-06 DIAGNOSIS — J029 Acute pharyngitis, unspecified: Secondary | ICD-10-CM | POA: Diagnosis present

## 2024-03-06 LAB — POCT RAPID STREP A (OFFICE): Rapid Strep A Screen: NEGATIVE

## 2024-03-06 MED ORDER — PROMETHAZINE-DM 6.25-15 MG/5ML PO SYRP: 5.0000 mL | ORAL_SOLUTION | Freq: Four times a day (QID) | ORAL | 0 refills | Status: AC | PRN

## 2024-03-06 NOTE — ED Provider Notes (Signed)
 [facility]   [id number] 03/06/24 Arrival Time: 1148  ASSESSMENT & PLAN:  1. Sore throat   2. Acute cough     No signs of peritonsillar abscess.  Meds ordered this encounter  Medications   promethazine-dextromethorphan (PROMETHAZINE-DM) 6.25-15 MG/5ML syrup    Sig: Take 5 mLs by mouth 4 (four) times daily as needed for cough.    Dispense:  118 mL    Refill:  0    Results for orders placed or performed during the hospital encounter of 03/06/24  POCT rapid strep A   Collection Time: 03/06/24 12:12 PM  Result Value Ref Range   Rapid Strep A Screen Negative Negative   Labs Reviewed  POCT RAPID STREP A (OFFICE) - Normal  CULTURE, GROUP A STREP [facility])    OTC analgesics and throat care as needed     Discharge Instructions      You may use over the counter ibuprofen or acetaminophen  as needed.  For a sore throat, over the counter products such as [employer] Peroxyl Mouth Sore Rinse or Chloraseptic Sore Throat Spray may provide some temporary relief. Your rapid strep test was negative today. We have sent your throat swab for culture and will let you know of any positive results.      Reviewed expectations re: course of current medical issues. Questions answered. Outlined signs and symptoms indicating need for more acute intervention. Patient verbalized understanding. After Visit Summary given.   SUBJECTIVE:  Ellen Fisher is a 58 y.o. female who reports a sore throat. With cough; several days. Cough worse at night. Denies fever. Normal PO intake.  No tx PTA.   OBJECTIVE:  Vitals:   03/06/24 1159 03/06/24 1200  BP:  129/86  Pulse:  74  Resp:  18  Temp:  98.7 F (37.1 C)  TempSrc:  Oral  SpO2:  97%  Weight: 108 kg      General appearance: alert; no distress HEENT: throat with mild erythema and cobblestoning; uvula is midline Neck: supple with FROM; no lymphadenopathy Lungs: speaks full sentences without difficulty; unlabored Abd: soft;  non-tender Skin: reveals no rash; warm and dry Psychological: alert and cooperative; normal mood and affect  Allergies  Allergen Reactions   Contrast Media [Iodinated Contrast Media] Anaphylaxis   Gadolinium Derivatives Anaphylaxis   Penicillins Hives and Itching    Has patient had a PCN reaction causing immediate rash, facial/tongue/throat swelling, SOB or lightheadedness with hypotension: Yes Has patient had a PCN reaction causing severe rash involving mucus membranes or skin necrosis: No Has patient had a PCN reaction that required hospitalization: No Has patient had a PCN reaction occurring within the last 10 years: Yes If all of the above answers are NO, then may proceed with Cephalosporin use.   Lisinopril Cough    Past Medical History:  Diagnosis Date   Hypertension    Social History   Socioeconomic History   Marital status: Single    Spouse name: Not on file   Number of children: Not on file   Years of education: Not on file   Highest education level: Not on file  Occupational History   Not on file  Tobacco Use   Smoking status: Never    Passive exposure: Never   Smokeless tobacco: Never  Vaping Use   Vaping status: Never Used  Substance and Sexual Activity   Alcohol use: Yes    Comment: occassionally   Drug use: No   Sexual activity: Not on file  Other Topics  Concern   Not on file  Social History Narrative   Not on file   Social Drivers of Health   Financial Resource Strain: Not on file  Food Insecurity: Not on file  Transportation Needs: Not on file  Physical Activity: Not on file  Stress: Not on file  Social Connections: Not on file  Intimate Partner Violence: Not on file   History reviewed. No pertinent family history.         Rolinda Rogue, MD 03/06/24 [phone]

## 2024-03-06 NOTE — ED Triage Notes (Signed)
 Pt presents c/o sore throat and cough x 3 days. Pt reports her son was positive for strep this past week. Pt denies emesis or any additional sxs.

## 2024-03-06 NOTE — Discharge Instructions (Addendum)
 You may use over the counter ibuprofen or acetaminophen as needed.  For a sore throat, over the counter products such as [employer] Peroxyl Mouth Sore Rinse or Chloraseptic Sore Throat Spray may provide some temporary relief. Your rapid strep test was negative today. We have sent your throat swab for culture and will let you know of any positive results.

## 2024-03-08 LAB — CULTURE, GROUP A STREP (THRC)

## 2024-03-10 ENCOUNTER — Ambulatory Visit (HOSPITAL_COMMUNITY): Payer: Self-pay
